# Patient Record
Sex: Male | Born: 1975 | Race: White | Hispanic: No | Marital: Married | Smoking: Never smoker
Health system: Southern US, Community
[De-identification: ages and names within clinical notes are randomized; demographics above are authoritative.]

## PROBLEM LIST (undated history)

## (undated) DIAGNOSIS — E7800 Pure hypercholesterolemia, unspecified: Secondary | ICD-10-CM

## (undated) DIAGNOSIS — E119 Type 2 diabetes mellitus without complications: Secondary | ICD-10-CM

## (undated) DIAGNOSIS — I10 Essential (primary) hypertension: Secondary | ICD-10-CM

## (undated) DIAGNOSIS — R569 Unspecified convulsions: Secondary | ICD-10-CM

## (undated) DIAGNOSIS — Z87442 Personal history of urinary calculi: Secondary | ICD-10-CM

## (undated) DIAGNOSIS — G4730 Sleep apnea, unspecified: Secondary | ICD-10-CM

## (undated) HISTORY — PX: OTHER SURGICAL HISTORY: SHX169

## (undated) HISTORY — DX: Unspecified convulsions: R56.9

---

## 1997-11-22 ENCOUNTER — Emergency Department (HOSPITAL_COMMUNITY): Admission: EM | Admit: 1997-11-22 | Discharge: 1997-11-22 | Payer: Self-pay | Admitting: Emergency Medicine

## 2007-12-03 ENCOUNTER — Emergency Department (HOSPITAL_COMMUNITY): Admission: EM | Admit: 2007-12-03 | Discharge: 2007-12-04 | Payer: Self-pay | Admitting: Emergency Medicine

## 2007-12-11 ENCOUNTER — Ambulatory Visit (HOSPITAL_COMMUNITY): Admission: RE | Admit: 2007-12-11 | Discharge: 2007-12-11 | Payer: Self-pay | Admitting: Orthopedic Surgery

## 2008-02-16 ENCOUNTER — Encounter: Admission: RE | Admit: 2008-02-16 | Discharge: 2008-02-16 | Payer: Self-pay | Admitting: Orthopedic Surgery

## 2008-03-20 ENCOUNTER — Ambulatory Visit (HOSPITAL_BASED_OUTPATIENT_CLINIC_OR_DEPARTMENT_OTHER): Admission: RE | Admit: 2008-03-20 | Discharge: 2008-03-21 | Payer: Self-pay | Admitting: Orthopedic Surgery

## 2009-12-08 ENCOUNTER — Encounter: Admission: RE | Admit: 2009-12-08 | Discharge: 2009-12-08 | Payer: Self-pay | Admitting: Family Medicine

## 2010-06-01 ENCOUNTER — Encounter: Payer: Self-pay | Admitting: Orthopedic Surgery

## 2010-07-19 IMAGING — CT CT EXTREM UP W/O CM*L*
4 series · 18 of 36 positions shown, 19 images · non-contrast
Comparison: Left forearm radiographs 12/03/2007

CLINICAL DATA: Distal radial fracture 12/03/2007 status post ORIF.
Persistent wrist pain with decreased range of motion at the distal
radioulnar joint.

CT OF THE LEFT WRIST WITHOUT CONTRAST
TECHNIQUE: Multidetector CT imaging of the left wrist and distal
forearm was performed according to the standard protocol without
intravenous contrast. Multiplanar CT image reconstructions were
also generated.

[Series 3: left wrist/soft tissue · axial · 0.38mm/px · z∈[+46,+160]mm · 7 of 277 slices shown]
[im 31/277  soft-tissue]
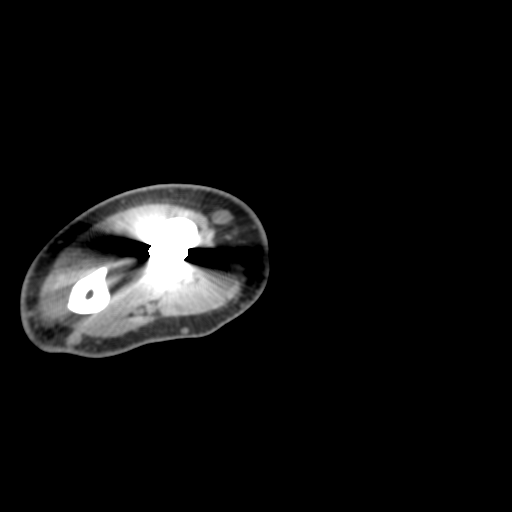
[im 62/277  soft-tissue]
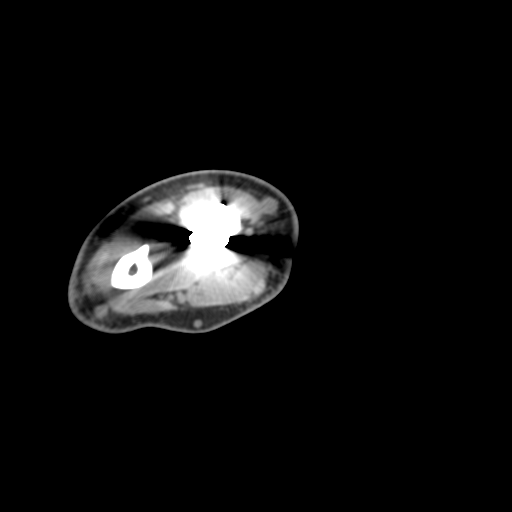
[im 93/277  soft-tissue]
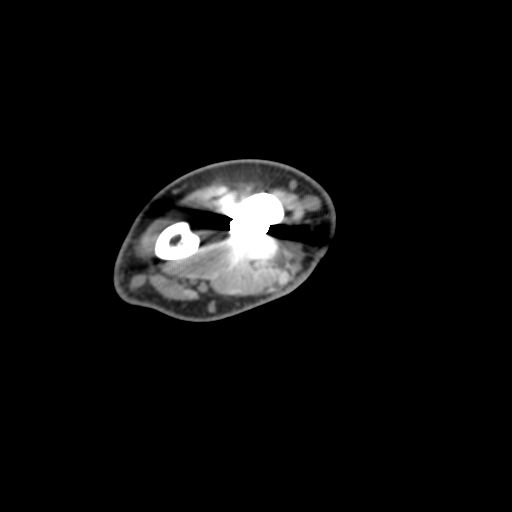
[im 123/277  soft-tissue]
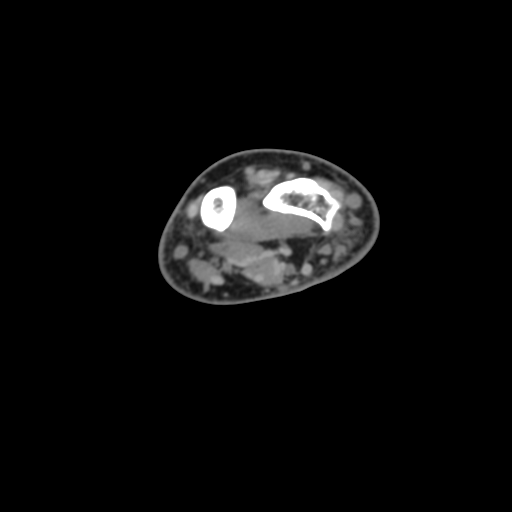
[im 154/277  soft-tissue]
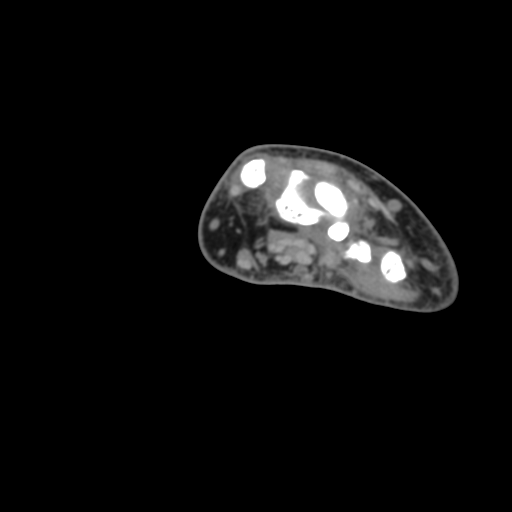
[im 185/277  soft-tissue]
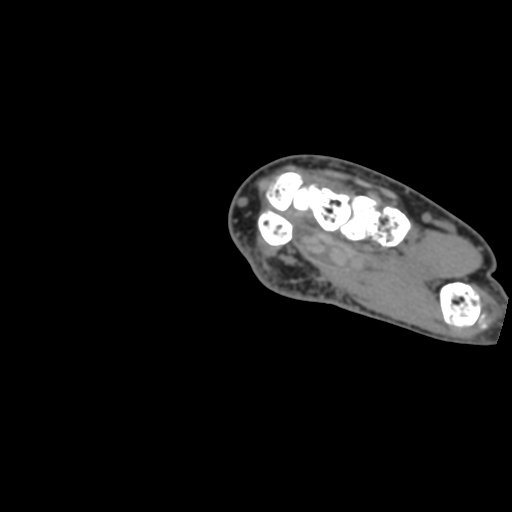
[im 215/277  soft-tissue]
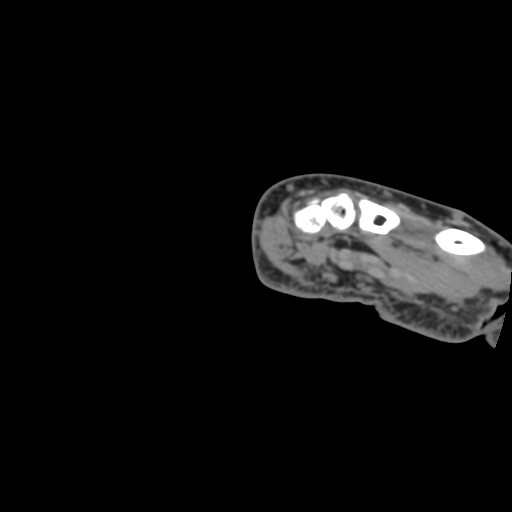

[Series 400: left wrist/axial/reformat · axial · 0.29mm/px · z∈[+31,+77]mm · 2 of 106 slices shown, 3 images]
[im 36/106  soft-tissue]
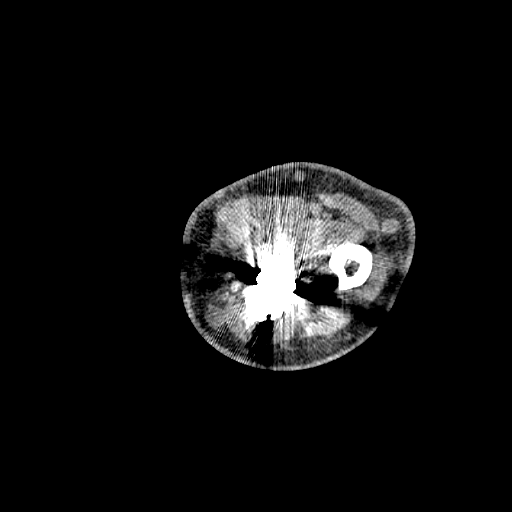
[im 36/106  bone]
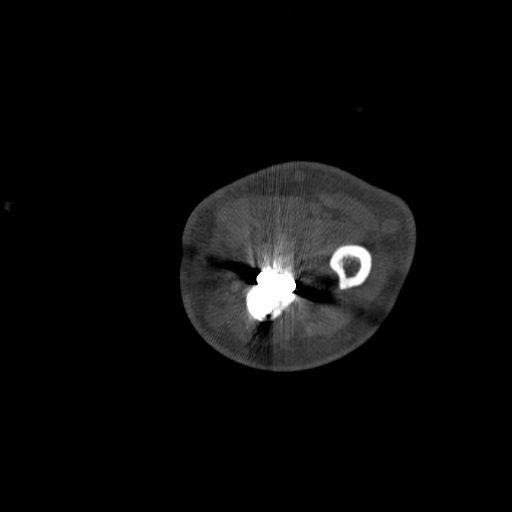
[im 71/106  bone]
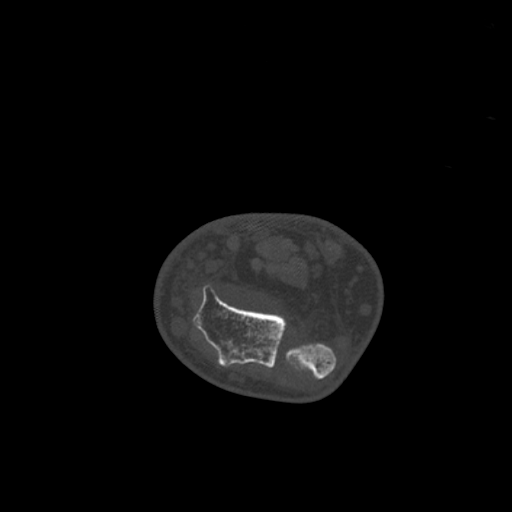

[Series 401: left wrist/cor · coronal · 0.29mm/px · 3 of 60 slices shown]
[im 12/60  bone]
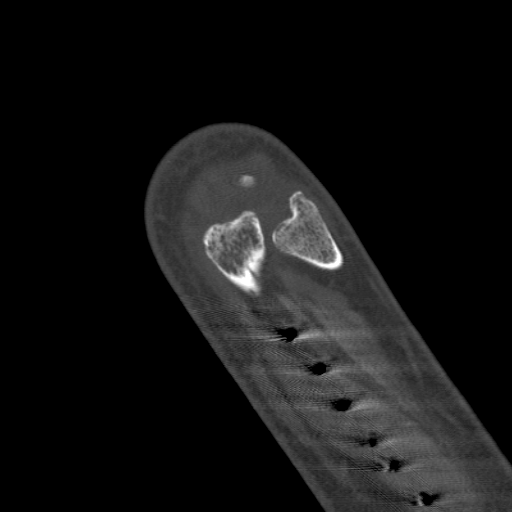
[im 24/60  bone]
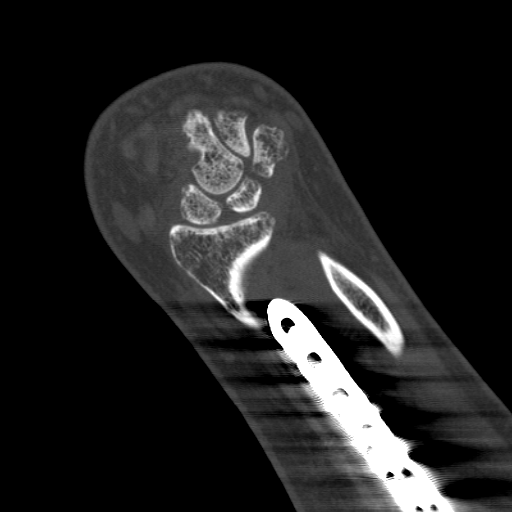
[im 36/60  bone]
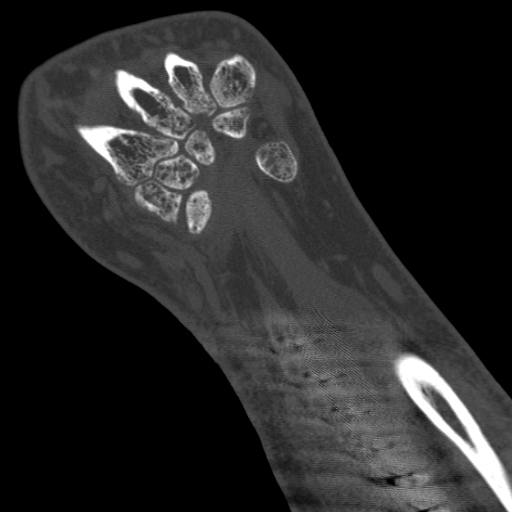

[Series 402: left wrist/sag · sagittal · 0.29mm/px · 6 of 85 slices shown]
[im 31/85  bone]
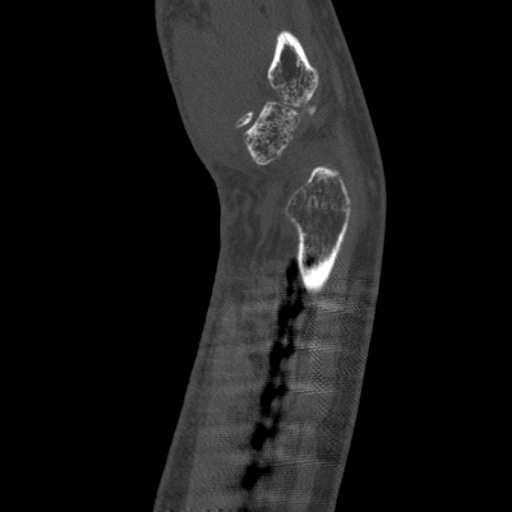
[im 37/85  bone]
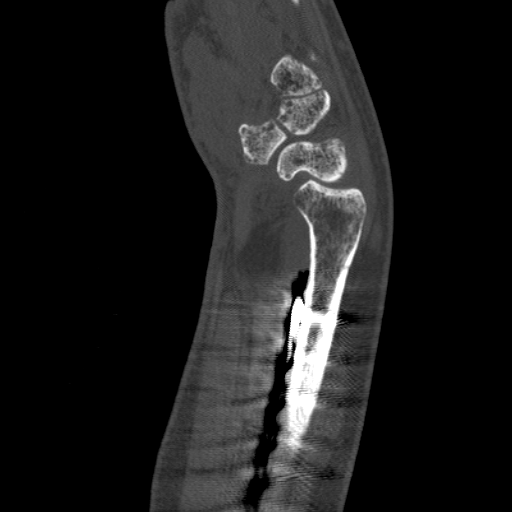
[im 44/85  bone]
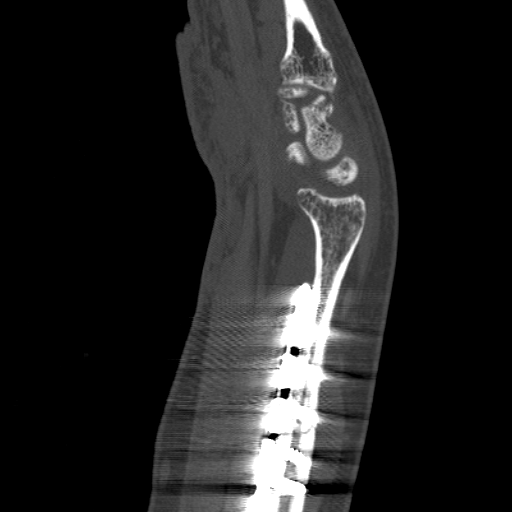
[im 51/85  bone]
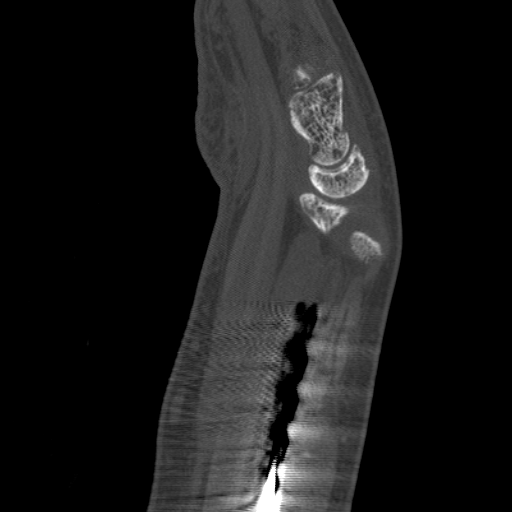
[im 57/85  bone]
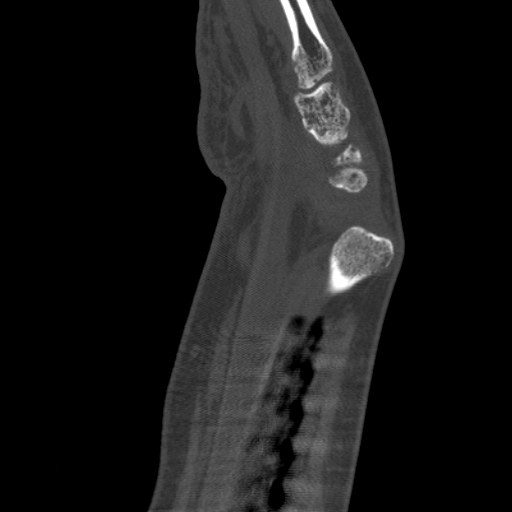
[im 58/85  soft-tissue]
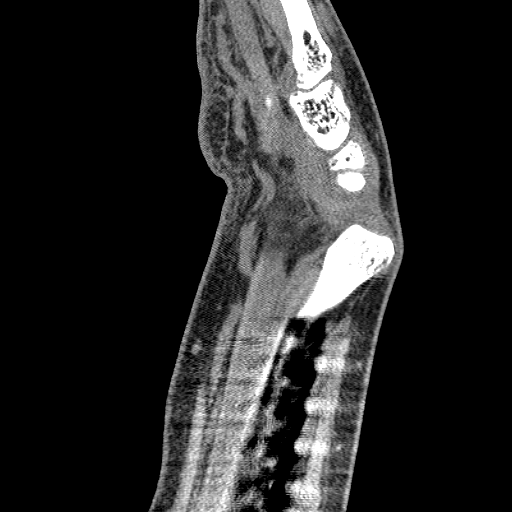

[18 of 36 positions shown; findings below may reference images not displayed]

FINDINGS: There is anatomic alignment of the distal radial
diaphyseal fractures status post plate and screw open reduction and
internal fixation.  The hardware is intact.  The fracture line
remains apparent, but there is evidence of interval partial
healing.  No acute fractures are demonstrated.

There is dorsal subluxation of the distal ulna from the sigmoid
notch of the distal radius.  Slight irregularity of the ulnar
styloid is noted, possibly due to a healing fracture at that
location.  A small well corticated ossicle adjacent to the ulnar
styloid is probably an accessory ossicle.

No carpal bone fracture is evident.  The carpal bone alignment is
normal.  There is diffuse osteopenia throughout the carpal bones
and visualized portions of the distal radius and ulna compatible
with disuse.  No acute soft tissue abnormalities are demonstrated.
IMPRESSION: 1.  Dorsal subluxation of the distal ulna from the sigmoid notch of
the distal radius consistent with Eliud Woodcock injury and distal
radioulnar joint instability.  There is a probable tear of the
palmar distal radial ulnar ligament, and this injury can be
associated with injury of the triangular fibrocartilage complex.
2.  Possible healing minimal fracture of the ulnar styloid.  No
carpal bone fractures are evident.
3.  Healing fracture of the distal radial diaphysis status post
ORIF.  Alignment is normal.
4.  Diffuse osteopenia.

## 2010-09-22 NOTE — Op Note (Signed)
[name], [name]               ACCOUNT NO.:  [id number]   MEDICAL RECORD NO.:  [id number]          PATIENT TYPE:  AMB   LOCATION:  DSC                          FACILITY:  [facility]   PHYSICIAN:  Artist Pais. 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   PROCEDURES:  1. Takedown malunion left distal radius with redo open reduction and      internal fixation using six-hole small fragment contoured DCP      plate.  2. Open reduction of dislocated distal radioulnar joint.   SURGEONS:  Artist Pais. Mina Marble, MD and Cindee Salt, MD   ANESTHESIA:  General and ax block.   TOURNIQUET TIME:  1 hour and 50 minutes.   COMPLICATIONS:  None.   DRAINS:  None.   OPERATIVE REPORT:  The patient was taken to the operating suite.  After  induction of adequate general anesthesia, left upper extremity and left  iliac crest were prepped and draped in usual sterile fashion.  Once this  was done, an Esmarch was used to exsanguinate the limb.  Tourniquet was  then inflated to 250 mmHg.  At this point in time and using his previous  incision on the volar aspect of the distal radius, the skin was incised.  Dissection was carried down to the interval between the radial artery  and the flexor carpi radialis.  This was carefully divided.  Dissection  was carried down to the level of the previously placed six-hole locking  plate.  The plate was carefully removed.  The fracture site was  identified and appeared to be nonunited with some motion of the fracture  site.  This was carefully freed up using a Therapist, nutritional.  Once the  fracture site had been freed up, the patient was placed into attempted  full supination.   There was still some block supination, therefore, the  hand was then pronated and incision was made over the distal radioulnar  joint and dissection was carried down.  The sheath overlying the  extensor digiti minimi was opened and the capsule overlying distal  radioulnar joint was carefully opened.  There was significant wear of  the articular cartilage on the ulnar head and interposed tissue was seen  to be between the sigmoid knots and the ulnar head was carefully  removed.  The hand was then able to be fully supinated and the distal  radioulnar joint was reduced.  At this point in time, two 0.62 K-wires  were driven across the distal radioulnar joint to hold the distal  radioulnar joint reduced.  While this was done, the hand was in full  supination.  Attention was then paid to the radius which was re-plated  using a contoured six-hole DCP plate using same drill holes as the  previous plate.  After this was done, the K-wires were actually removed  at this point in time, and the patient had full supination, full  pronation and distal radioulnar joint was stable in supination.  The  capsule overlying the distal radioulnar joint was repaired using a 2-0  undyed Vicryl as well as a sheath overlying the extensor digiti minimi,  2-0 Vicryl was then used to close the subcutaneous tissues of the volar  wound and 4-0 Monocryl subcuticular stitches were used to close both  skin  incisions.  The patient was then placed in a sterile dressing of Steri-  Strips, 4x4s, fluffs and a splint with the arm flexed to 90 degrees at  the elbow and full supination.  The patient tolerated the procedure well  and went to the recovery room in stable fashion.      Artist Pais Mina Marble, M.D.  Electronically Signed     MAW/MEDQ  D:  03/20/2008  T:  03/21/2008  Job:  [id number]

## 2010-09-22 NOTE — Op Note (Signed)
NAMEWAYDEN, SCHWERTNER               ACCOUNT NO.:  [id number]   MEDICAL RECORD NO.:  [id number]          PATIENT TYPE:  AMB   LOCATION:  SDS                          FACILITY:  [facility]   PHYSICIAN:  Nadara Mustard, MD     DATE OF BIRTH:  22-Jan-1976   DATE OF PROCEDURE:  12/11/2007  DATE OF DISCHARGE:  12/11/2007                               OPERATIVE REPORT   PREOPERATIVE DIAGNOSIS:  Left mid shaft radius fracture.   POSTOPERATIVE DIAGNOSIS:  Left mid shaft radius fracture.   PROCEDURE:  Open reduction and internal fixation, left radius.   SURGEON:  Nadara Mustard, MD   ANESTHESIA:  General.   ESTIMATED BLOOD LOSS:  Minimal.   ANTIBIOTIC:  Kefzol 1 g.   DRAINS:  None.   COMPLICATIONS:  None.   TOURNIQUET TIME:  None.   DISPOSITION:  To PACU in stable condition.   INDICATIONS FOR PROCEDURE:  The patient is a 35 year old gentleman who  sustained the left mid shaft radius fracture.  It is a Galeazzi-type  fracture, it is unstable.  The patient do not have a dislocation at  distal radioulnar joint and presents at this time for open reduction and  internal fixation.  Risks and benefits were discussed including  infection, neurovascular injury, persistent pain, failure of fixation,  and need for additional surgery.  The patient states he understands and  wished to proceed at this time.   PROCEDURE:  The patient was brought to OR room #4  and underwent a  general anesthetic.  After adequate level of anesthesia was obtained,  the patient's left upper extremity was prepped using DuraPrep and draped  into a sterile field.  An extensile approach of Sherilyn Cooter was used.  This  was carried down.  The vascular bundle was retracted ulnarly.  Dissection was carried down to the radius.  The fracture was freshened,  and the fracture was reduced.  Using a 6-hole 3.5 locking cortical  plate, six screws were placed in total with three screws proximally and  three screws distally with a total of  six cortices, both proximally and  distally, five of the screws were locking.  One screw was placed that  was a nonlocking screw to allow for compression across the fracture  site.  The wound was irrigated with normal saline.  The subcu was closed  using 2-0 Vicryl.  The skin was closed using Proximate staples.  Postreduction radiographs showed a congruent distal radial and ulnar  joint.  There was a good alignment in both AP  and lateral planes.  After closure with staples, the wound edges were  infiltrated with a total of 30 mL of 0.5% Marcaine plain.  The wounds  were covered with adaptic, orthopedic sponges, Webril, and Coban.  The  patient was extubated and taken to the PACU in stable condition.  Plan  for discharge is to home.  Prescription for Tylox for pain.      Nadara Mustard, MD  Electronically Signed     MVD/MEDQ  D:  12/11/2007  T:  12/12/2007  Job:  [id number]

## 2011-02-05 LAB — COMPREHENSIVE METABOLIC PANEL
ALT: 75 — ABNORMAL HIGH
AST: 55 — ABNORMAL HIGH
BUN: 13
Chloride: 100
Potassium: 3.1 — ABNORMAL LOW
Total Protein: 7.4

## 2011-02-05 LAB — CBC
HCT: 42.2
Hemoglobin: 14.4
MCV: 86.9
Platelets: 233
RDW: 13.2
WBC: 5.2

## 2011-02-09 LAB — POCT I-STAT, CHEM 8
BUN: 17
Calcium, Ion: 1.24
Chloride: 103
Creatinine, Ser: 1
Glucose, Bld: 91
HCT: 40
Hemoglobin: 13.6
Potassium: 3.9
Sodium: 141
TCO2: 27

## 2011-02-09 LAB — POCT HEMOGLOBIN-HEMACUE
Hemoglobin: 13.7
Hemoglobin: 13.7

## 2013-01-19 ENCOUNTER — Emergency Department (HOSPITAL_BASED_OUTPATIENT_CLINIC_OR_DEPARTMENT_OTHER)
Admission: EM | Admit: 2013-01-19 | Discharge: 2013-01-19 | Disposition: A | Discharge status: Home / Self Care | Payer: BC Managed Care – PPO | Attending: Emergency Medicine | Admitting: Emergency Medicine

## 2013-01-19 ENCOUNTER — Encounter (HOSPITAL_BASED_OUTPATIENT_CLINIC_OR_DEPARTMENT_OTHER): Payer: Self-pay | Admitting: Emergency Medicine

## 2013-01-19 DIAGNOSIS — R339 Retention of urine, unspecified: Secondary | ICD-10-CM | POA: Insufficient documentation

## 2013-01-19 DIAGNOSIS — Z79899 Other long term (current) drug therapy: Secondary | ICD-10-CM | POA: Insufficient documentation

## 2013-01-19 DIAGNOSIS — Z862 Personal history of diseases of the blood and blood-forming organs and certain disorders involving the immune mechanism: Secondary | ICD-10-CM | POA: Insufficient documentation

## 2013-01-19 DIAGNOSIS — Z8639 Personal history of other endocrine, nutritional and metabolic disease: Secondary | ICD-10-CM | POA: Insufficient documentation

## 2013-01-19 DIAGNOSIS — I10 Essential (primary) hypertension: Secondary | ICD-10-CM | POA: Insufficient documentation

## 2013-01-19 HISTORY — DX: Essential (primary) hypertension: I10

## 2013-01-19 HISTORY — DX: Pure hypercholesterolemia, unspecified: E78.00

## 2013-01-19 LAB — URINALYSIS, ROUTINE W REFLEX MICROSCOPIC
Bilirubin Urine: NEGATIVE
Hgb urine dipstick: NEGATIVE
Nitrite: NEGATIVE
Protein, ur: NEGATIVE mg/dL
Specific Gravity, Urine: 1.018 (ref 1.005–1.030)
Urobilinogen, UA: 0.2 mg/dL (ref 0.0–1.0)

## 2013-01-19 NOTE — ED Notes (Signed)
MD at bedside. 

## 2013-01-19 NOTE — ED Notes (Signed)
Pt having dysuria x one week.  Pt feels like he cannot urinate easily, difficult to start, urgency and frequency.  Pt states he has been seen at MD office and referred to urologist.

## 2013-01-19 NOTE — ED Provider Notes (Signed)
CSN: [id number]     Arrival date & time 01/19/13  1646 History   This chart was scribed for Candyce Churn, MD by Karle Plumber, ED Scribe. This patient was seen in room MH10/MH10 and the patient's care was started at 6:19 PM.    Chief Complaint  Patient presents with  . Dysuria    Patient is a 37 y.o. male presenting with dysuria. The history is provided by the patient. No language interpreter was used.  Dysuria   HPI Comments:  [name] is a 37 y.o. male who presents to the Emergency Department complaining of intermittent difficulty urinating onset one week ago with moderate pain. Pt states this is the first time he has experienced these symptoms. He states he has had associated back pain and spots of blood on the tip of his penis. Pt states he saw his primary care physician and received a referral to the urologist and the appointment is in four days. He states that pressing on his sides makes the pain worse. He reports two episodes of intermittent bladder incontinence with the most recent being one month ago. He states that he has gone frequently since presenting to the ED today. Pt denies fever, testicular pain or any lower extremity numbness or tingling. Pt denies any personal history of kidney stones however his mother and father have had a history of them. He states he has been referred to urology from his primary physician.   Past Medical History  Diagnosis Date  . Hypertension   . Hypercholesteremia    No past surgical history on file. History reviewed. No pertinent family history. History  Substance Use Topics  . Smoking status: Never Smoker   . Smokeless tobacco: Not on file  . Alcohol Use: Yes     Comment: occasioanal    Review of Systems  Constitutional: Negative for fever.  Genitourinary: Positive for difficulty urinating. Negative for testicular pain.  Neurological: Negative for weakness and numbness.       Denies tingling.  All other systems reviewed and  are negative.    Allergies  Review of patient's allergies indicates no known allergies.  Home Medications   Current Outpatient Rx  Name  Route  Sig  Dispense  Refill  . amlodipine-benazepril (LOTREL) 2.5-10 MG per capsule   Oral   Take 1 capsule by mouth daily.         Marland Kitchen losartan (COZAAR) 50 MG tablet   Oral   Take 50 mg by mouth daily.         Marland Kitchen UNKNOWN TO PATIENT      Cholesterol medication          Triage Vitals: BP 129/89  Pulse 91  Temp(Src) 99.2 F (37.3 C) (Oral)  Resp 16  Ht 5\' 8"  (1.727 m)  Wt 200 lb (90.719 kg)  BMI 30.42 kg/m2  SpO2 100% Physical Exam  Nursing note and vitals reviewed. Constitutional: He is oriented to person, place, and time. He appears well-developed and well-nourished. No distress.  HENT:  Head: Normocephalic and atraumatic.  Mouth/Throat: Oropharynx is clear and moist.  Eyes: Conjunctivae are normal. Pupils are equal, round, and reactive to light. No scleral icterus.  Neck: Neck supple.  Cardiovascular: Normal rate, regular rhythm, normal heart sounds and intact distal pulses.   No murmur heard. Pulmonary/Chest: Effort normal and breath sounds normal. No stridor. No respiratory distress. He has no wheezes. He has no rales.  Abdominal: Soft. Normal appearance. He exhibits no distension. There is  no tenderness.  Genitourinary: Penis normal. Right testis shows no mass and no tenderness. Left testis shows no mass and no tenderness.  Musculoskeletal: Normal range of motion. He exhibits no edema.  Neurological: He is alert and oriented to person, place, and time.  Skin: Skin is warm and dry. No rash noted.  Psychiatric: He has a normal mood and affect. His behavior is normal.    ED Course  Procedures (including critical care time) DIAGNOSTIC STUDIES: Oxygen Saturation is 100% on RA, normal by my interpretation.   COORDINATION OF CARE:  6:28 PM- Discussed plan to obtain bladder scan. Advised pt to keep follow up appt with  Urologist. Pt verbalizes understanding and agrees to plan.  Medications - No data to display  Labs Review Labs Reviewed  URINALYSIS, ROUTINE W REFLEX MICROSCOPIC - Abnormal; Notable for the following:    APPearance CLOUDY (*)    All other components within normal limits   Imaging Review No results found.  MDM  No diagnosis found. 37 year old male with urinary retention. He has an appointment with a urologist in 5 days. However today he had sudden worsening of his symptoms. He was finally able to void some just before arrival. Postvoid residual showed 250 cc of urine. Urinalysis was normal. Of note, he had no back tenderness or pain, no fevers, no numbness in his groin, no weakness or loss of sensation of his lower extremity. Urinary retention most likely obstructive. Foley was recommended, but patient declined this. Return precautions given.  I personally performed the services described in this documentation, which was scribed in my presence. The recorded information has been reviewed and is accurate.     Candyce Churn, MD 01/20/13 [phone]

## 2017-09-27 DIAGNOSIS — M67431 Ganglion, right wrist: Secondary | ICD-10-CM | POA: Insufficient documentation

## 2018-06-21 DIAGNOSIS — E669 Obesity, unspecified: Secondary | ICD-10-CM | POA: Insufficient documentation

## 2018-06-21 DIAGNOSIS — I10 Essential (primary) hypertension: Secondary | ICD-10-CM | POA: Insufficient documentation

## 2018-10-28 DIAGNOSIS — G4733 Obstructive sleep apnea (adult) (pediatric): Secondary | ICD-10-CM | POA: Insufficient documentation

## 2019-06-27 DIAGNOSIS — E782 Mixed hyperlipidemia: Secondary | ICD-10-CM | POA: Insufficient documentation

## 2019-06-27 DIAGNOSIS — E1165 Type 2 diabetes mellitus with hyperglycemia: Secondary | ICD-10-CM | POA: Insufficient documentation

## 2019-06-27 DIAGNOSIS — E119 Type 2 diabetes mellitus without complications: Secondary | ICD-10-CM | POA: Insufficient documentation

## 2019-08-14 ENCOUNTER — Emergency Department (HOSPITAL_COMMUNITY)
Admission: EM | Admit: 2019-08-14 | Discharge: 2019-08-14 | Disposition: A | Discharge status: Home / Self Care | Payer: BLUE CROSS/BLUE SHIELD | Attending: Emergency Medicine | Admitting: Emergency Medicine

## 2019-08-14 ENCOUNTER — Emergency Department (HOSPITAL_COMMUNITY): Payer: BLUE CROSS/BLUE SHIELD

## 2019-08-14 ENCOUNTER — Encounter (HOSPITAL_COMMUNITY): Payer: Self-pay

## 2019-08-14 ENCOUNTER — Other Ambulatory Visit: Payer: Self-pay

## 2019-08-14 DIAGNOSIS — N23 Unspecified renal colic: Secondary | ICD-10-CM | POA: Diagnosis not present

## 2019-08-14 DIAGNOSIS — Z79899 Other long term (current) drug therapy: Secondary | ICD-10-CM | POA: Diagnosis not present

## 2019-08-14 DIAGNOSIS — I10 Essential (primary) hypertension: Secondary | ICD-10-CM | POA: Diagnosis not present

## 2019-08-14 DIAGNOSIS — R109 Unspecified abdominal pain: Secondary | ICD-10-CM | POA: Diagnosis present

## 2019-08-14 LAB — URINALYSIS, ROUTINE W REFLEX MICROSCOPIC
Bilirubin Urine: NEGATIVE
Glucose, UA: NEGATIVE mg/dL
Ketones, ur: NEGATIVE mg/dL
Leukocytes,Ua: NEGATIVE
Nitrite: NEGATIVE
Protein, ur: NEGATIVE mg/dL
Specific Gravity, Urine: 1.009 (ref 1.005–1.030)
pH: 5 (ref 5.0–8.0)

## 2019-08-14 MED ORDER — TAMSULOSIN HCL 0.4 MG PO CAPS: 0.4000 mg | ORAL_CAPSULE | Freq: Every day | ORAL | 0 refills | Status: DC

## 2019-08-14 MED ORDER — HYDROCODONE-ACETAMINOPHEN 5-300 MG PO TABS: 5.0000 mg | ORAL_TABLET | ORAL | 0 refills | Status: DC | PRN

## 2019-08-14 NOTE — ED Notes (Signed)
Patient ambulated to the restroom with no difficulty   ° °

## 2019-08-14 NOTE — ED Notes (Signed)
Patient returned from CT

## 2019-08-14 NOTE — ED Provider Notes (Signed)
[facility] DEPT Provider Note   CSN: [id number] Arrival date & time: 08/14/19  3244     History Chief Complaint  Patient presents with  . Flank Pain    [name] is a 44 y.o. male.  44 year old male presents with acute onset of right-sided flank pain which began yesterday.  2 days ago he did have some dark urine but denies any dysuria.  No fever or chills.  Pain radiates to his right groin but denies any testicular pain or swelling.  Pain has been so severe that he has had emesis.  Symptoms wax and wane and no prior diagnosis of kidney stone.  Took some Motrin with some relief        Past Medical History:  Diagnosis Date  . Hypercholesteremia   . Hypertension   . Seizure [facility])    as a child    There are no problems to display for this patient.   Past Surgical History:  Procedure Laterality Date  . arm fracture Left        History reviewed. No pertinent family history.  Social History   Tobacco Use  . Smoking status: Never Smoker  Substance Use Topics  . Alcohol use: Yes    Comment: occasioanal  . Drug use: Not on file    Home Medications Prior to Admission medications   Medication Sig Start Date End Date Taking? Authorizing Provider  amlodipine-benazepril (LOTREL) 2.5-10 MG per capsule Take 1 capsule by mouth daily.    [provider]  b complex vitamins capsule Take 1 capsule by mouth daily.    [provider]  gemfibrozil (LOPID) 600 MG tablet Take 600 mg by mouth 2 (two) times daily before a meal.    [provider]  losartan (COZAAR) 50 MG tablet Take 50 mg by mouth daily.    [provider]  Multiple Vitamin (MULTI VITAMIN DAILY PO) Take by mouth.    [provider]  Red Yeast Rice Extract (RED YEAST RICE PO) Take by mouth.    [provider]    Allergies    Trazodone and nefazodone  Review of Systems   Review of Systems  All other systems reviewed and are  negative.   Physical Exam Updated Vital Signs BP 135/86 (BP Location: Left Arm)   Pulse 76   Temp 98.4 F (36.9 C) (Oral)   Resp 18   Ht 1.753 m (5\' 9" )   Wt 103.4 kg   SpO2 98%   BMI 33.67 kg/m   Physical Exam Vitals and nursing note reviewed.  Constitutional:      General: He is not in acute distress.    Appearance: Normal appearance. He is well-developed. He is not toxic-appearing.  HENT:     Head: Normocephalic and atraumatic.  Eyes:     General: Lids are normal.     Conjunctiva/sclera: Conjunctivae normal.     Pupils: Pupils are equal, round, and reactive to light.  Neck:     Thyroid: No thyroid mass.     Trachea: No tracheal deviation.  Cardiovascular:     Rate and Rhythm: Normal rate and regular rhythm.     Heart sounds: Normal heart sounds. No murmur. No gallop.   Pulmonary:     Effort: Pulmonary effort is normal. No respiratory distress.     Breath sounds: Normal breath sounds. No stridor. No decreased breath sounds, wheezing, rhonchi or rales.  Abdominal:     General: Bowel sounds are normal.  There is no distension.     Palpations: Abdomen is soft.     Tenderness: There is no abdominal tenderness. There is no rebound.  Musculoskeletal:        General: No tenderness. Normal range of motion.     Cervical back: Normal range of motion and neck supple.  Skin:    General: Skin is warm and dry.     Findings: No abrasion or rash.  Neurological:     Mental Status: He is alert and oriented to person, place, and time.     GCS: GCS eye subscore is 4. GCS verbal subscore is 5. GCS motor subscore is 6.     Cranial Nerves: No cranial nerve deficit.     Sensory: No sensory deficit.  Psychiatric:        Speech: Speech normal.        Behavior: Behavior normal.     ED Results / Procedures / Treatments   Labs (all labs ordered are listed, but only abnormal results are displayed) Labs Reviewed  URINALYSIS, ROUTINE W REFLEX MICROSCOPIC - Abnormal; Notable for the  following components:      Result Value   Hgb urine dipstick LARGE (*)    Bacteria, UA RARE (*)    All other components within normal limits    EKG None  Radiology No results found.  Procedures Procedures (including critical care time)  Medications Ordered in ED Medications - No data to display  ED Course  I have reviewed the triage vital signs and the nursing notes.  Pertinent labs & imaging results that were available during my care of the patient were reviewed by me and considered in my medical decision making (see chart for details).    MDM Rules/Calculators/A&P                      Patient's urinalysis noted for hematuria.  CT scan consistent with kidney stone.  Pain is controlled at this time.  Prescribe medications to give referral to urology Final Clinical Impression(s) / ED Diagnoses Final diagnoses:  None    Rx / DC Orders ED Discharge Orders    None       Lorre Nick, MD 08/14/19 [phone]

## 2019-08-14 NOTE — ED Notes (Signed)
Patient to CT at this time

## 2019-08-14 NOTE — ED Triage Notes (Signed)
Pt reports R sided flank pain. Denies dysuria. States that the pain started yesterday and he has been up all night, unable to get comfortable. Endorses nausea with several episodes of emesis. Denies hx of kidney stones.

## 2019-09-19 DIAGNOSIS — D649 Anemia, unspecified: Secondary | ICD-10-CM | POA: Insufficient documentation

## 2020-06-11 DIAGNOSIS — N201 Calculus of ureter: Secondary | ICD-10-CM | POA: Insufficient documentation

## 2020-06-19 DIAGNOSIS — N35919 Unspecified urethral stricture, male, unspecified site: Secondary | ICD-10-CM | POA: Insufficient documentation

## 2021-07-07 ENCOUNTER — Other Ambulatory Visit: Payer: Self-pay

## 2021-07-09 ENCOUNTER — Encounter: Payer: Self-pay | Admitting: Family Medicine

## 2021-07-09 ENCOUNTER — Ambulatory Visit (INDEPENDENT_AMBULATORY_CARE_PROVIDER_SITE_OTHER): Payer: BC Managed Care – PPO | Admitting: Family Medicine

## 2021-07-09 ENCOUNTER — Other Ambulatory Visit: Payer: Self-pay

## 2021-07-09 VITALS — BP 138/78 | HR 69 | Temp 97.6°F | Ht 69.0 in | Wt 234.0 lb

## 2021-07-09 DIAGNOSIS — Z1211 Encounter for screening for malignant neoplasm of colon: Secondary | ICD-10-CM | POA: Diagnosis not present

## 2021-07-09 DIAGNOSIS — N35819 Other urethral stricture, male, unspecified site: Secondary | ICD-10-CM

## 2021-07-09 DIAGNOSIS — H6122 Impacted cerumen, left ear: Secondary | ICD-10-CM | POA: Insufficient documentation

## 2021-07-09 DIAGNOSIS — F419 Anxiety disorder, unspecified: Secondary | ICD-10-CM

## 2021-07-09 DIAGNOSIS — N200 Calculus of kidney: Secondary | ICD-10-CM

## 2021-07-09 DIAGNOSIS — E119 Type 2 diabetes mellitus without complications: Secondary | ICD-10-CM

## 2021-07-09 DIAGNOSIS — I10 Essential (primary) hypertension: Secondary | ICD-10-CM | POA: Diagnosis not present

## 2021-07-09 DIAGNOSIS — E669 Obesity, unspecified: Secondary | ICD-10-CM

## 2021-07-09 DIAGNOSIS — E782 Mixed hyperlipidemia: Secondary | ICD-10-CM

## 2021-07-09 DIAGNOSIS — F341 Dysthymic disorder: Secondary | ICD-10-CM

## 2021-07-09 DIAGNOSIS — N201 Calculus of ureter: Secondary | ICD-10-CM

## 2021-07-09 DIAGNOSIS — Z794 Long term (current) use of insulin: Secondary | ICD-10-CM | POA: Diagnosis not present

## 2021-07-09 LAB — URINALYSIS, ROUTINE W REFLEX MICROSCOPIC
Bilirubin Urine: NEGATIVE
Hgb urine dipstick: NEGATIVE
Leukocytes,Ua: NEGATIVE
Nitrite: NEGATIVE
RBC / HPF: NONE SEEN (ref 0–?)
Specific Gravity, Urine: >=1.03 — AB (ref 1.000–1.030)
Total Protein, Urine: NEGATIVE
Urine Glucose: >=1000 — AB
Urobilinogen, UA: 0.2 (ref 0.0–1.0)
pH: 5.5 (ref 5.0–8.0)

## 2021-07-09 LAB — COMPREHENSIVE METABOLIC PANEL
ALT: 39 U/L (ref 0–53)
AST: 22 U/L (ref 0–37)
Albumin: 4.5 g/dL (ref 3.5–5.2)
Alkaline Phosphatase: 65 U/L (ref 39–117)
BUN: 16 mg/dL (ref 6–23)
CO2: 25 mEq/L (ref 19–32)
Calcium: 9.4 mg/dL (ref 8.4–10.5)
Chloride: 100 mEq/L (ref 96–112)
Creatinine, Ser: 0.8 mg/dL (ref 0.40–1.50)
GFR: 105.76 mL/min (ref >60.00)
Glucose, Bld: 264 mg/dL — ABNORMAL HIGH (ref 70–99)
Potassium: 4 mEq/L (ref 3.5–5.1)
Sodium: 135 mEq/L (ref 135–145)
Total Bilirubin: 0.5 mg/dL (ref 0.2–1.2)
Total Protein: 7.3 g/dL (ref 6.0–8.3)

## 2021-07-09 LAB — MICROALBUMIN / CREATININE URINE RATIO
Creatinine,U: 147.7 mg/dL
Microalb Creat Ratio: 2.2 mg/g (ref 0.0–30.0)
Microalb, Ur: 3.2 mg/dL — ABNORMAL HIGH (ref 0.0–1.9)

## 2021-07-09 LAB — CBC
HCT: 37.1 % — ABNORMAL LOW (ref 39.0–52.0)
Hemoglobin: 13.1 g/dL (ref 13.0–17.0)
MCHC: 35.3 g/dL (ref 30.0–36.0)
MCV: 84.8 fl (ref 78.0–100.0)
Platelets: 239 10*3/uL (ref 150.0–400.0)
RBC: 4.37 Mil/uL (ref 4.22–5.81)
RDW: 13.8 % (ref 11.5–15.5)
WBC: 4.2 10*3/uL (ref 4.0–10.5)

## 2021-07-09 LAB — HEMOGLOBIN A1C: Hgb A1c MFr Bld: 9.3 % — ABNORMAL HIGH (ref 4.6–6.5)

## 2021-07-09 LAB — LIPID PANEL
Cholesterol: 242 mg/dL — ABNORMAL HIGH (ref 0–200)
HDL: 35.2 mg/dL — ABNORMAL LOW (ref >39.00)
Total CHOL/HDL Ratio: 7
Triglycerides: 1175 mg/dL — ABNORMAL HIGH (ref 0.0–149.0)

## 2021-07-09 LAB — LDL CHOLESTEROL, DIRECT: Direct LDL: 75 mg/dL

## 2021-07-09 NOTE — Progress Notes (Signed)
? ?New Patient Office Visit ? ?Subjective:  ?Patient ID: [name], male    DOB: 10-12-1975  Age: 46 y.o. MRN: [id number] ? ?CC:  ?Chief Complaint  ?Patient presents with  ? Establish Care  ?  NP/establish care, would like referral for colonoscopy, eye doctor, and urologist.  ? ? ?HPI ?[name] presents for establishment of care and follow-up of hypertension, diabetes, mixed hyperlipidemia, urethral stricture with recurrent renal lithiasis, dysthymia with anxiety.  He is married and has a 70 year old child.  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  Running 2 businesses is led to a lot of stress anxiety and sadness.  Developed ongoing severe abdominal cramping and loose stools with metformin.  He cannot tolerate it. ? ?Past Medical History:  ?Diagnosis Date  ? Hypercholesteremia   ? Hypertension   ? Seizure ([place])   ? as a child  ? ? ?Past Surgical History:  ?Procedure Laterality Date  ? arm fracture Left   ? ? ?History reviewed. No pertinent family history. ? ?Social History  ? ?Socioeconomic History  ? Marital status: Married  ?  Spouse name: Not on file  ? Number of children: Not on file  ? Years of education: Not on file  ? Highest education level: Not on file  ?Occupational History  ? Not on file  ?Tobacco Use  ? Smoking status: Never  ? Smokeless tobacco: Never  ?Vaping Use  ? Vaping Use: Never used  ?Substance and Sexual Activity  ? Alcohol use: Yes  ?  Comment: occasioanal  ? Drug use: Never  ? Sexual activity: Yes  ?Other Topics Concern  ? Not on file  ?Social History Narrative  ? Not on file  ? ?Social Determinants of Health  ? ?Financial Resource Strain: Not on file  ?Food Insecurity: Not on file  ?Transportation Needs: Not on file  ?Physical Activity: Not on file  ?Stress: Not on file  ?Social Connections: Not on file  ?Intimate Partner Violence: Not on file  ? ? ?ROS ?Review of Systems  ?Constitutional:  Negative for chills, diaphoresis, fatigue, fever and unexpected weight change.  ?HENT:  Negative.    ?Eyes:  Negative for photophobia and visual disturbance.  ?Respiratory: Negative.    ?Cardiovascular: Negative.   ?Gastrointestinal: Negative.   ?Endocrine: Negative for polyphagia and polyuria.  ?Genitourinary: Negative.   ?Musculoskeletal:  Negative for gait problem and joint swelling.  ?Neurological:  Negative for speech difficulty, weakness and light-headedness.  ? ?Objective:  ? ?Today's Vitals: BP 138/78 (BP Location: Right Arm, Patient Position: Sitting, Cuff Size: Large)   Pulse 69   Temp 97.6 ?F (36.4 ?C) (Temporal)   Ht 5' 9"  (1.753 m)   Wt 234 lb (106.1 kg)   SpO2 97%   BMI 34.56 kg/m?  ? ?Physical Exam ?Vitals and nursing note reviewed.  ?Constitutional:   ?   General: He is not in acute distress. ?   Appearance: Normal appearance. He is obese. He is not ill-appearing, toxic-appearing or diaphoretic.  ?HENT:  ?   Head: Normocephalic and atraumatic.  ?   Right Ear: Tympanic membrane, ear canal and external ear normal.  ?   Left Ear: There is impacted cerumen.  ?   Mouth/Throat:  ?   Mouth: Mucous membranes are moist.  ?   Pharynx: Oropharynx is clear. No oropharyngeal exudate or posterior oropharyngeal erythema.  ?Eyes:  ?   Extraocular Movements: Extraocular movements intact.  ?   Conjunctiva/sclera: Conjunctivae normal.  ?  Pupils: Pupils are equal, round, and reactive to light.  ?Cardiovascular:  ?   Rate and Rhythm: Normal rate.  ?Pulmonary:  ?   Effort: Pulmonary effort is normal.  ?   Breath sounds: Normal breath sounds.  ?Abdominal:  ?   General: Bowel sounds are normal.  ?Musculoskeletal:  ?   Cervical back: No rigidity or tenderness.  ?Lymphadenopathy:  ?   Cervical: No cervical adenopathy.  ?Skin: ?   General: Skin is warm and dry.  ?Neurological:  ?   Mental Status: He is alert and oriented to person, place, and time.  ?Psychiatric:     ?   Mood and Affect: Mood normal.     ?   Behavior: Behavior normal.  ? ? ?Assessment & Plan:  ? ?Problem List Items Addressed This Visit    ? ?  ? Cardiovascular and Mediastinum  ? Essential hypertension  ? Relevant Medications  ? amLODipine-olmesartan (AZOR) 10-40 MG tablet  ? atenolol (TENORMIN) 50 MG tablet  ? fenofibrate (TRICOR) 145 MG tablet  ? Other Relevant Orders  ? CBC  ? Comp Met (CMET)  ?  ? Endocrine  ? Type 2 diabetes mellitus without complication, with long-term current use of insulin ([place])  ? Relevant Medications  ? amLODipine-olmesartan (AZOR) 10-40 MG tablet  ? glipiZIDE (GLUCOTROL) 10 MG tablet  ? Other Relevant Orders  ? Ambulatory referral to Ophthalmology  ? Comp Met (CMET)  ? Hemoglobin A1c  ? Urinalysis, Routine w reflex microscopic  ? Microalbumin / creatinine urine ratio  ?  ? Nervous and Auditory  ? Impacted cerumen of left ear  ?  ? Genitourinary  ? Left ureteral stone  ? Relevant Orders  ? Ambulatory referral to Urology  ? Urethral stricture  ? Relevant Orders  ? Ambulatory referral to Urology  ? Kidney stones - Primary  ? Relevant Orders  ? Ambulatory referral to Urology  ?  ? Other  ? Mixed hyperlipidemia  ? Relevant Medications  ? amLODipine-olmesartan (AZOR) 10-40 MG tablet  ? atenolol (TENORMIN) 50 MG tablet  ? fenofibrate (TRICOR) 145 MG tablet  ? Other Relevant Orders  ? Lipid Profile  ? Comp Met (CMET)  ? Urinalysis, Routine w reflex microscopic  ? Microalbumin / creatinine urine ratio  ? Obesity (BMI 30.0-34.9)  ? Anxiety  ? Relevant Medications  ? venlafaxine (EFFEXOR) 75 MG tablet  ? Dysthymia  ? Relevant Medications  ? venlafaxine (EFFEXOR) 75 MG tablet  ? Encounter for screening colonoscopy  ? Relevant Orders  ? Ambulatory referral to Gastroenterology  ? ? ?Outpatient Encounter Medications as of 07/09/2021  ?Medication Sig  ? amLODipine-olmesartan (AZOR) 10-40 MG tablet Take 1 tablet by mouth daily.  ? ascorbic acid (VITAMIN C) 500 MG tablet Take by mouth.  ? atenolol (TENORMIN) 50 MG tablet Take 50 mg by mouth 2 (two) times daily.  ? Cholecalciferol 50 MCG (2000 UT) CAPS Take by mouth.  ? fenofibrate (TRICOR)  145 MG tablet Take 1 tablet by mouth daily.  ? Garlic 5465 MG CAPS Take by mouth.  ? glipiZIDE (GLUCOTROL) 10 MG tablet Take 10 mg by mouth 2 (two) times daily.  ? Multiple Vitamin (MULTI VITAMIN DAILY PO) Take by mouth.  ? Potassium 75 MG TABS Take by mouth.  ? Red Yeast Rice Extract (RED YEAST RICE PO) Take by mouth.  ? tamsulosin (FLOMAX) 0.4 MG CAPS capsule Take 1 capsule (0.4 mg total) by mouth daily.  ? UNABLE TO FIND CBD 25 mg  tablet, 1 tablet oral daily as needed  ? venlafaxine (EFFEXOR) 75 MG tablet Take 1 tablet by mouth daily.  ? [DISCONTINUED] amlodipine-benazepril (LOTREL) 2.5-10 MG per capsule Take 1 capsule by mouth daily.  ? [DISCONTINUED] b complex vitamins capsule Take 1 capsule by mouth daily. (Patient not taking: Reported on 07/09/2021)  ? [DISCONTINUED] gemfibrozil (LOPID) 600 MG tablet Take 600 mg by mouth 2 (two) times daily before a meal. (Patient not taking: Reported on 07/09/2021)  ? [DISCONTINUED] HYDROcodone-Acetaminophen 5-300 MG TABS Take 1 tablet by mouth every 4 (four) hours as needed.  ? [DISCONTINUED] losartan (COZAAR) 50 MG tablet Take 50 mg by mouth daily. (Patient not taking: Reported on 07/09/2021)  ? ?No facility-administered encounter medications on file as of 07/09/2021.  ? ? ?Follow-up: Return in about 3 months (around 10/09/2021), or if symptoms worsen or fail to improve.  ? ?Information was given on BMI and obesity in adults.  Also information was given on earwax.  We discussed the possibility of changing his diabetes medicines.  He is not on a statin but takes red rice yeast.  Referral to urology for urethral stricture recurrent lithiasis.  Referral for colonoscopy.  Referral for eye check.  Advised that we will have frequent follow-up the time being. ? ?Libby Maw, MD ? ?

## 2021-07-13 ENCOUNTER — Telehealth: Payer: Self-pay | Admitting: Family Medicine

## 2021-07-13 NOTE — Telephone Encounter (Signed)
Returned call no answer LMTCB 

## 2021-07-13 NOTE — Telephone Encounter (Signed)
Pt called inquire about  ?

## 2021-07-13 NOTE — Telephone Encounter (Signed)
Pt was asked to call in to schedule an appointment on Monday for a mychart visit to go over his labs. He does not want to do this. 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 He would like a call back from a nurse to go over his labs. Please advise pt at [phone] ?

## 2021-07-14 ENCOUNTER — Encounter: Payer: Self-pay | Admitting: *Deleted

## 2021-07-14 NOTE — Telephone Encounter (Signed)
Pt has scheduled himself a mychart visit for Friday. He wants to know what Dr. Doreene Burke is planning on doing in this visit. 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 He wants to know if Dr. Doreene Burke is planning on giving him a new prescription. Please advise pt ?

## 2021-07-14 NOTE — Telephone Encounter (Signed)
Returned patients call went over expectations for virtual visit   ?

## 2021-07-17 ENCOUNTER — Telehealth: Payer: Self-pay | Admitting: Family Medicine

## 2021-07-17 ENCOUNTER — Encounter: Payer: Self-pay | Admitting: Family Medicine

## 2021-07-17 ENCOUNTER — Telehealth (INDEPENDENT_AMBULATORY_CARE_PROVIDER_SITE_OTHER): Payer: BC Managed Care – PPO | Admitting: Family Medicine

## 2021-07-17 DIAGNOSIS — E781 Pure hyperglyceridemia: Secondary | ICD-10-CM | POA: Diagnosis not present

## 2021-07-17 DIAGNOSIS — E1165 Type 2 diabetes mellitus with hyperglycemia: Secondary | ICD-10-CM | POA: Diagnosis not present

## 2021-07-17 MED ORDER — FENOFIBRATE 145 MG PO TABS: 145.0000 mg | ORAL_TABLET | Freq: Every day | ORAL | 2 refills | Status: DC

## 2021-07-17 MED ORDER — RYBELSUS 7 MG PO TABS: 7.0000 mg | ORAL_TABLET | Freq: Every day | ORAL | 2 refills | Status: DC

## 2021-07-17 NOTE — Progress Notes (Signed)
Established Patient Office Visit  Subjective:  Patient ID: [name], male    DOB: Oct 03, 1975  Age: 46 y.o. MRN: [id number]  CC:  Chief Complaint  Patient presents with   video visit     Pt discussing labs, and medication changes     HPI [name] presents for follow-up of lab work that had revealed significantly elevated triglycerides and poorly controlled diabetes.  [name] had taken Lipitor for several months but then developed more recent loose stool and it was discontinued in favor of glipizide.  [name] has not been taking the Tricor.  [name] was fasting for his recent lab work.  [name] is taking red rice yeast and his LDL was 75.  Past Medical History:  Diagnosis Date   Hypercholesteremia    Hypertension    Seizure [facility])    as a child    Past Surgical History:  Procedure Laterality Date   arm fracture Left     History reviewed. No pertinent family history.  Social History   Socioeconomic History   Marital status: Married    Spouse name: Not on file   Number of children: Not on file   Years of education: Not on file   Highest education level: Not on file  Occupational History   Not on file  Tobacco Use   Smoking status: Never   Smokeless tobacco: Never  Vaping Use   Vaping Use: Never used  Substance and Sexual Activity   Alcohol use: Yes    Comment: occasioanal   Drug use: Never   Sexual activity: Yes  Other Topics Concern   Not on file  Social History Narrative   Not on file   Social Determinants of Health   Financial Resource Strain: Not on file  Food Insecurity: Not on file  Transportation Needs: Not on file  Physical Activity: Not on file  Stress: Not on file  Social Connections: Not on file  Intimate Partner Violence: Not on file    Outpatient Medications Prior to Visit  Medication Sig Dispense Refill   amLODipine-olmesartan (AZOR) 10-40 MG tablet Take 1 tablet by mouth daily.     ascorbic acid (VITAMIN C) 500 MG tablet Take by mouth.      atenolol (TENORMIN) 50 MG tablet Take 50 mg by mouth 2 (two) times daily.     Cholecalciferol 50 MCG (2000 UT) CAPS Take by mouth.     Garlic 123XX123 MG CAPS Take by mouth.     glipiZIDE (GLUCOTROL) 10 MG tablet Take 10 mg by mouth 2 (two) times daily.     Multiple Vitamin (MULTI VITAMIN DAILY PO) Take by mouth.     Potassium 75 MG TABS Take by mouth.     Red Yeast Rice Extract (RED YEAST RICE PO) Take by mouth.     tamsulosin (FLOMAX) 0.4 MG CAPS capsule Take 1 capsule (0.4 mg total) by mouth daily. 30 capsule 0   UNABLE TO FIND CBD 25 mg tablet, 1 tablet oral daily as needed     venlafaxine (EFFEXOR) 75 MG tablet Take 1 tablet by mouth daily.     fenofibrate (TRICOR) 145 MG tablet Take 1 tablet by mouth daily.     No facility-administered medications prior to visit.    Allergies  Allergen Reactions   Escitalopram Oxalate Nausea And Vomiting    Sleepy   headache   Latex Other (See Comments)    Certain gloves cause itching    Trazodone And Nefazodone    Metformin  And Related Diarrhea    ROS Review of Systems  Constitutional: Negative.   Respiratory: Negative.    Cardiovascular: Negative.   Gastrointestinal: Negative.   Genitourinary: Negative.      Objective:    Physical Exam Vitals and nursing note reviewed.  Constitutional:      Appearance: Normal appearance.  HENT:     Head: Normocephalic and atraumatic.     Right Ear: External ear normal.     Left Ear: External ear normal.  Eyes:     General: No scleral icterus.       Right eye: No discharge.        Left eye: No discharge.     Extraocular Movements: Extraocular movements intact.     Conjunctiva/sclera: Conjunctivae normal.  Pulmonary:     Effort: Pulmonary effort is normal.  Neurological:     Mental Status: [name] is alert and oriented to person, place, and time.  Psychiatric:        Mood and Affect: Mood normal.        Behavior: Behavior normal.    There were no vitals taken for this visit. Wt Readings from  Last 3 Encounters:  07/09/21 234 lb (106.1 kg)  08/14/19 228 lb (103.4 kg)  01/19/13 200 lb (90.7 kg)     Health Maintenance Due  Topic Date Due   FOOT EXAM  Never done   OPHTHALMOLOGY EXAM  Never done   HIV Screening  Never done   Hepatitis C Screening  Never done    There are no preventive care reminders to display for this patient.  No results found for: TSH Lab Results  Component Value Date   WBC 4.2 07/09/2021   HGB 13.1 07/09/2021   HCT 37.1 (L) 07/09/2021   MCV 84.8 07/09/2021   PLT 239.0 07/09/2021   Lab Results  Component Value Date   NA 135 07/09/2021   K 4.0 07/09/2021   CO2 25 07/09/2021   GLUCOSE 264 (H) 07/09/2021   BUN 16 07/09/2021   CREATININE 0.80 07/09/2021   BILITOT 0.5 07/09/2021   ALKPHOS 65 07/09/2021   AST 22 07/09/2021   ALT 39 07/09/2021   PROT 7.3 07/09/2021   ALBUMIN 4.5 07/09/2021   CALCIUM 9.4 07/09/2021   GFR 105.76 07/09/2021   Lab Results  Component Value Date   CHOL 242 (H) 07/09/2021   Lab Results  Component Value Date   HDL 35.20 (L) 07/09/2021   No results found for: [facility] Lab Results  Component Value Date   TRIG (H) 07/09/2021    1175.0 Triglyceride is over 400; calculations on Lipids are invalid.   Lab Results  Component Value Date   CHOLHDL 7 07/09/2021   Lab Results  Component Value Date   HGBA1C 9.3 (H) 07/09/2021      Assessment & Plan:   Problem List Items Addressed This Visit       Other   Hypertriglyceridemia - Primary   Relevant Medications   fenofibrate (TRICOR) 145 MG tablet   Other Visit Diagnoses     Poorly controlled diabetes mellitus ([place])       Relevant Medications   Semaglutide (RYBELSUS) 7 MG TABS       Meds ordered this encounter  Medications   fenofibrate (TRICOR) 145 MG tablet    Sig: Take 1 tablet (145 mg total) by mouth daily.    Dispense:  90 tablet    Refill:  2   Semaglutide (RYBELSUS) 7 MG TABS  Sig: Take 7 mg by mouth daily.    Dispense:  30 tablet     Refill:  2    Follow-up: No follow-ups on file.  Have added Tricor 145 mg and semaglutide Rybelsus 7 mg daily.  Continue the glipizide for now.  Continue red rice yeast but could consider low-dose atorvastatin.  May consider trying extended release metformin.  [name] will check with his pharmacist to see if that is what [name] had been taking.  Discussed risks of hypertriglyceridemia including pancreatitis and hepatic steatosis. Libby Maw, MD  Virtual Visit via Video Note  I connected with [name] on 07/17/21 at  8:40 AM EST by a video enabled telemedicine application and verified that I am speaking with the correct person using two identifiers.  Location: Patient: home with son.  Provider: work   I discussed the limitations of evaluation and management by telemedicine and the availability of in person appointments. The patient expressed understanding and agreed to proceed.  History of Present Illness:    Observations/Objective:   Assessment and Plan:   Follow Up Instructions:    I discussed the assessment and treatment plan with the patient. The patient was provided an opportunity to ask questions and all were answered. The patient agreed with the plan and demonstrated an understanding of the instructions.   The patient was advised to call back or seek an in-person evaluation if the symptoms worsen or if the condition fails to improve as anticipated.  I provided 30 minutes of non-face-to-face time during this encounter.   Libby Maw, MD

## 2021-07-17 NOTE — Telephone Encounter (Signed)
Merry Proud tried to send the info Dr. Ethelene Hal requested through [place] it was not working. He would like a phone call back so he can pass that info on. ?

## 2021-07-18 ENCOUNTER — Encounter: Payer: Self-pay | Admitting: Family Medicine

## 2021-07-20 ENCOUNTER — Telehealth: Payer: Self-pay | Admitting: Family Medicine

## 2021-07-20 NOTE — Telephone Encounter (Signed)
Pt was taking this med Glipizide from last provider and wants to know if DR Doreene Burke would like for him to continue taking this. ?

## 2021-07-20 NOTE — Telephone Encounter (Signed)
Returned call needed clarification on Metformin ?

## 2021-07-20 NOTE — Telephone Encounter (Signed)
Pt is aware and said he would like to start on the XR metformin ?

## 2021-07-21 MED ORDER — METFORMIN HCL ER 500 MG PO TB24: 500.0000 mg | ORAL_TABLET | Freq: Every day | ORAL | 1 refills | Status: DC

## 2021-07-21 NOTE — Addendum Note (Signed)
Addended by: Jon Billings on: 07/21/2021 07:53 AM ? ? Modules accepted: Orders ? ?

## 2021-08-04 ENCOUNTER — Other Ambulatory Visit: Payer: Self-pay

## 2021-08-04 MED ORDER — VENLAFAXINE HCL 75 MG PO TABS: 75.0000 mg | ORAL_TABLET | Freq: Every day | ORAL | 1 refills | Status: DC

## 2021-08-04 NOTE — Telephone Encounter (Signed)
Refill request for pending medication. Np visit 07/09/2021 please advise. ?

## 2021-09-04 LAB — HM DIABETES EYE EXAM

## 2021-10-02 ENCOUNTER — Telehealth: Payer: Self-pay | Admitting: Family Medicine

## 2021-10-02 NOTE — Telephone Encounter (Signed)
Pt has an upcoming appt on 6/2 and would like to clear some things up beforehand. Requesting call back.

## 2021-10-02 NOTE — Telephone Encounter (Signed)
Returned patients called scheduled morning appointment

## 2021-10-09 ENCOUNTER — Ambulatory Visit: Payer: BC Managed Care – PPO | Admitting: Family Medicine

## 2021-10-27 ENCOUNTER — Encounter: Payer: Self-pay | Admitting: Family Medicine

## 2021-10-27 ENCOUNTER — Ambulatory Visit: Payer: BC Managed Care – PPO | Admitting: Family Medicine

## 2021-10-27 VITALS — BP 128/80 | HR 78 | Temp 97.1°F | Ht 69.0 in | Wt 229.0 lb

## 2021-10-27 DIAGNOSIS — E1165 Type 2 diabetes mellitus with hyperglycemia: Secondary | ICD-10-CM | POA: Diagnosis not present

## 2021-10-27 DIAGNOSIS — E781 Pure hyperglyceridemia: Secondary | ICD-10-CM | POA: Diagnosis not present

## 2021-10-27 LAB — BASIC METABOLIC PANEL
BUN: 16 mg/dL (ref 6–23)
CO2: 23 mEq/L (ref 19–32)
Calcium: 9.4 mg/dL (ref 8.4–10.5)
Chloride: 101 mEq/L (ref 96–112)
Creatinine, Ser: 0.81 mg/dL (ref 0.40–1.50)
GFR: 105.15 mL/min (ref >60.00)
Glucose, Bld: 255 mg/dL — ABNORMAL HIGH (ref 70–99)
Potassium: 4.2 mEq/L (ref 3.5–5.1)
Sodium: 134 mEq/L — ABNORMAL LOW (ref 135–145)

## 2021-10-27 LAB — LIPID PANEL
Cholesterol: 222 mg/dL — ABNORMAL HIGH (ref 0–200)
HDL: 38.8 mg/dL — ABNORMAL LOW (ref >39.00)
Total CHOL/HDL Ratio: 6
Triglycerides: 989 mg/dL — ABNORMAL HIGH (ref 0.0–149.0)

## 2021-10-27 LAB — HEMOGLOBIN A1C: Hgb A1c MFr Bld: 9.4 % — ABNORMAL HIGH (ref 4.6–6.5)

## 2021-10-27 LAB — LDL CHOLESTEROL, DIRECT: Direct LDL: 84 mg/dL

## 2021-10-27 MED ORDER — RYBELSUS 7 MG PO TABS: 7.0000 mg | ORAL_TABLET | Freq: Every day | ORAL | 2 refills | Status: DC

## 2021-10-27 MED ORDER — METFORMIN HCL ER 500 MG PO TB24: 500.0000 mg | ORAL_TABLET | Freq: Two times a day (BID) | ORAL | 1 refills | Status: DC

## 2021-10-27 MED ORDER — FENOFIBRATE 145 MG PO TABS: 145.0000 mg | ORAL_TABLET | Freq: Every day | ORAL | 2 refills | Status: AC

## 2021-10-27 NOTE — Progress Notes (Addendum)
Established Patient Office Visit  Subjective   Patient ID: [name], male    DOB: [birth year]  Age: 46 y.o. MRN: [id number]  Chief Complaint  Patient presents with   Follow-up    3 month follow up, discuss medications being decreased. Patient fasting.     HPI for follow-up of elevated triglycerides and poorly controlled type 2 diabetes continues with glipizide twice daily.  He has been taking the Rybelsus intermittently.  Fortunately he is tolerating the extended release form of metformin.  He wonders if he could get off of some of these medications.  Last hemoglobin A1c was 9.4.  He now has been compliant with his fenofibrate.  He understands that he is at risk for pancreatitis.  He will need fenofibrate most likely for the remainder of his life.  Elevated triglycerides run in the family    Review of Systems  Constitutional: Negative.   HENT: Negative.    Eyes:  Negative for blurred vision, discharge and redness.  Respiratory: Negative.    Cardiovascular: Negative.   Gastrointestinal:  Negative for abdominal pain.  Genitourinary: Negative.   Musculoskeletal: Negative.  Negative for myalgias.  Skin:  Negative for rash.  Neurological:  Negative for tingling, loss of consciousness and weakness.  Endo/Heme/Allergies:  Negative for polydipsia.      Objective:     BP 128/80 (BP Location: Right Arm, Patient Position: Sitting, Cuff Size: Large)   Pulse 78   Temp (!) 97.1 F (36.2 C) (Temporal)   Ht 5\' 9"  (1.753 m)   Wt 229 lb (103.9 kg)   SpO2 99%   BMI 33.82 kg/m  Wt Readings from Last 3 Encounters:  10/27/21 229 lb (103.9 kg)  07/09/21 234 lb (106.1 kg)  08/14/19 228 lb (103.4 kg)      Physical Exam Constitutional:      General: He is not in acute distress.    Appearance: Normal appearance. He is not ill-appearing, toxic-appearing or diaphoretic.  HENT:     Head: Normocephalic and atraumatic.     Right Ear: External ear normal.     Left Ear: External ear  normal.     Mouth/Throat:     Mouth: Mucous membranes are moist.     Pharynx: Oropharynx is clear. No oropharyngeal exudate or posterior oropharyngeal erythema.  Eyes:     General: No scleral icterus.       Right eye: No discharge.        Left eye: No discharge.     Extraocular Movements: Extraocular movements intact.     Conjunctiva/sclera: Conjunctivae normal.     Pupils: Pupils are equal, round, and reactive to light.  Cardiovascular:     Rate and Rhythm: Normal rate and regular rhythm.     Pulses:          Dorsalis pedis pulses are 2+ on the right side and 2+ on the left side.       Posterior tibial pulses are 1+ on the right side and 1+ on the left side.  Pulmonary:     Effort: Pulmonary effort is normal. No respiratory distress.     Breath sounds: Normal breath sounds.  Abdominal:     General: Bowel sounds are normal.     Tenderness: There is no abdominal tenderness. There is no guarding.  Musculoskeletal:     Cervical back: No rigidity or tenderness.  Skin:    General: Skin is warm and dry.  Neurological:     Mental Status: He is  alert and oriented to person, place, and time.  Psychiatric:        Mood and Affect: Mood normal.        Behavior: Behavior normal.    Diabetic Foot Exam - Simple   Simple Foot Form Diabetic Foot exam was performed with the following findings: Yes 10/27/2021  8:37 AM  Visual Inspection See comments: Yes Sensation Testing Intact to touch and monofilament testing bilaterally: Yes Pulse Check Posterior Tibialis and Dorsalis pulse intact bilaterally: Yes Comments Feet are cavus bilaterally without lesions or ulcers.       No results found for any visits on 10/27/21.    The 10-year ASCVD risk score (Arnett DK, et al., 2019) is: 11.2%    Assessment & Plan:   Problem List Items Addressed This Visit       Other   Hypertriglyceridemia   Relevant Medications   fenofibrate (TRICOR) 145 MG tablet   Other Relevant Orders   Lipid  panel   Other Visit Diagnoses     Poorly controlled diabetes mellitus ([facility])    -  Primary   Relevant Medications   metFORMIN (GLUCOPHAGE-XR) 500 MG 24 hr tablet   Semaglutide (RYBELSUS) 7 MG TABS   Other Relevant Orders   Basic metabolic panel   Hemoglobin A1c       Return in about 3 months (around 01/27/2022).  Have discontinued glipizide.  Patient will take his Rybelsus daily.  Have increased the metformin XL 500 mg to twice daily.  Advised 30 minutes of sustained exercise such as walking daily.  Reminded him that his food choices make a difference.  Advised him to avoid all sweets he drinks and sweets and general.  Return fasting in 3 months.  Mliss Sax, MD   01/19/22 addendum: Patient's wife lost her job and they no longer have health insurance.  Cannot afford the semaglutide.  Have increased metformin to 500 mg twice daily.  Have added glipizide 10 mg before every meal twice daily back to his regimen.  Have changed from Azor to amlodipine 10 mg daily with irbesartan 150 mg daily.  Olmesartan is not on the discount list.  He must return to clinic in 3 months for follow-up.

## 2021-11-03 ENCOUNTER — Encounter: Payer: Self-pay | Admitting: Family Medicine

## 2021-11-22 ENCOUNTER — Encounter: Payer: Self-pay | Admitting: Family Medicine

## 2022-01-18 ENCOUNTER — Telehealth: Payer: Self-pay | Admitting: Family Medicine

## 2022-01-18 DIAGNOSIS — I10 Essential (primary) hypertension: Secondary | ICD-10-CM

## 2022-01-18 MED ORDER — AMLODIPINE-OLMESARTAN 10-40 MG PO TABS: 1.0000 | ORAL_TABLET | Freq: Every day | ORAL | 3 refills | Status: DC

## 2022-01-18 NOTE — Telephone Encounter (Signed)
Caller Name: Emma Schupp Call back phone #: [phone]  Reason for Call: Pt's wife has lost job/insurance. Pt needs two refills however the price without insurance is 1100$ for one. He has been without since Saturday and would like a  call back to go over options.

## 2022-01-18 NOTE — Telephone Encounter (Signed)
Patient calling for refill on amlodipine states that he is currently uninsured due to wife losing job patient needing refill on Rybelsus states the out of pocket cost is $1100 would like something cheaper that he can afford with no insurance. Patient also asking for refill on Zofran. Please advise.

## 2022-01-19 MED ORDER — GLIPIZIDE 10 MG PO TABS: 10.0000 mg | ORAL_TABLET | Freq: Two times a day (BID) | ORAL | 3 refills | Status: AC

## 2022-01-19 MED ORDER — IRBESARTAN 150 MG PO TABS: 150.0000 mg | ORAL_TABLET | Freq: Every day | ORAL | 2 refills | Status: DC

## 2022-01-19 MED ORDER — METFORMIN HCL ER 500 MG PO TB24: 500.0000 mg | ORAL_TABLET | Freq: Every day | ORAL | 2 refills | Status: DC

## 2022-01-19 MED ORDER — AMLODIPINE BESYLATE 10 MG PO TABS: 10.0000 mg | ORAL_TABLET | Freq: Every day | ORAL | 2 refills | Status: DC

## 2022-01-19 NOTE — Telephone Encounter (Signed)
LVM with provider recommendations, med changes, approval to [facility] to Dr. Janee Morn. Advised pt to call back if I can be of further assistance.

## 2022-01-19 NOTE — Telephone Encounter (Signed)
Caller Name: [facility] Call back phone #: [phone]  Reason for Call: Pharmacy called because there had been two instructions sent in for the Metformin. Requested for a call back or for the prescription to be resubmitted with clarification

## 2022-01-19 NOTE — Telephone Encounter (Addendum)
Pt requested call from manager. I spent approximately 15 minutes on a call with him.  Pt had to cancel 9/20 follow up appointment due to financial situation without insurance and wife lost her job. They are trying to get insurance through marketplace but won't have it until October at least.  Pt is concerned about being without meds due to loss of insurance right now. Is there an alternative med for Rybelsus? He has lost weight since taking and is down to 212 lbs. $1000 w/o insurance, and $900 with [employer] coupon and he cannot afford it.  Also needing Amlodipine/Olmesartan RX to be changed. It is $794.88 for 90 day supply from [facility]. He said he cannot afford.   I spoke with Katrina, pharm tech at [facility], and she said Amlodipine 10mg  is $24 for #90. Losartan 50mg  is $24 for #90. If these two as separate meds would be an alternative  Pt states there were conversations about his pancreas in the past. He said he was never referred out for that. Did a referral need to be placed for him?  Patient states maybe patient-provider relationship is not the best fit for him and requests to see another MD within the office and has asked for [facility] to Dr. for future visit.  Please let me know how I can help.

## 2022-01-19 NOTE — Addendum Note (Signed)
Addended by: Nadene Rubins A on: 01/19/2022 11:17 AM   Modules accepted: Orders

## 2022-01-25 ENCOUNTER — Telehealth: Payer: Self-pay | Admitting: Family Medicine

## 2022-01-25 NOTE — Telephone Encounter (Signed)
Pharmacist from [facility] is calling to clarify the directions for the Metformin sent over on 01/19/22. He says there are two sets of directions.  [facility] [address] [place], [place] - [address]  [address], [place] [address]  Phone:  [phone]  Fax:  [phone]  Please advise

## 2022-01-26 NOTE — Telephone Encounter (Signed)
Please advise message below pharmacist calling for clarity on Metformin direction say take once a day and also take twice a day. Which should patient be taking?

## 2022-01-26 NOTE — Telephone Encounter (Signed)
Pharmacy aware of message below

## 2022-01-27 ENCOUNTER — Ambulatory Visit: Payer: BC Managed Care – PPO | Admitting: Family Medicine

## 2022-02-11 ENCOUNTER — Encounter: Payer: Self-pay | Admitting: Family Medicine

## 2022-04-14 ENCOUNTER — Telehealth: Payer: Self-pay | Admitting: Family Medicine

## 2022-04-14 DIAGNOSIS — E1165 Type 2 diabetes mellitus with hyperglycemia: Secondary | ICD-10-CM

## 2022-04-14 NOTE — Telephone Encounter (Signed)
Patient called in regarding his metFORMIN (GLUCOPHAGE-XR) 500 MG 24 hr tablet; He just realized that the signature states Take 1 tablet (500 mg total) by mouth daily with breakfast. Take 1 tablet twice daily. Marland Kitchen He states he has only been taking one tablet. He inquired when the second tablet should be taken if the 1st one is taken with breakfast. Please advise.

## 2022-04-14 NOTE — Telephone Encounter (Signed)
Pt want to know if you can give him a call . Pt has appointment questions and meds.

## 2022-04-15 MED ORDER — METFORMIN HCL ER 500 MG PO TB24: 500.0000 mg | ORAL_TABLET | Freq: Two times a day (BID) | ORAL | 2 refills | Status: AC

## 2022-04-15 NOTE — Telephone Encounter (Signed)
Patient is aware of annotation below and verbalized understanding.  

## 2022-04-15 NOTE — Addendum Note (Signed)
Addended by: Andrez Grime on: 04/15/2022 08:07 AM   Modules accepted: Orders

## 2022-04-15 NOTE — Telephone Encounter (Signed)
Caller Name: [name] Call back phone #: [phone]  Reason for Call: Please call pt, he did not want to include a message.

## 2022-05-09 ENCOUNTER — Other Ambulatory Visit: Payer: Self-pay | Admitting: Family Medicine

## 2022-05-09 DIAGNOSIS — E1165 Type 2 diabetes mellitus with hyperglycemia: Secondary | ICD-10-CM

## 2022-05-09 DIAGNOSIS — E781 Pure hyperglyceridemia: Secondary | ICD-10-CM

## 2022-05-14 ENCOUNTER — Telehealth: Payer: Self-pay | Admitting: Family Medicine

## 2022-05-14 NOTE — Telephone Encounter (Signed)
Pt has a physical scheduled for 05/17/22, Monday at 8:20. He was told to fast for labs. He is saying he needs to take his meds with food. Please advise pt at   [phone] [facility])

## 2022-05-14 NOTE — Telephone Encounter (Signed)
Patient is aware of annotation below and agreed with your recommendations and verbalized understanding.

## 2022-05-17 ENCOUNTER — Encounter: Payer: Self-pay | Admitting: Family Medicine

## 2022-05-17 ENCOUNTER — Telehealth: Payer: Self-pay

## 2022-05-17 ENCOUNTER — Ambulatory Visit (INDEPENDENT_AMBULATORY_CARE_PROVIDER_SITE_OTHER): Payer: BLUE CROSS/BLUE SHIELD | Admitting: Family Medicine

## 2022-05-17 VITALS — BP 134/84 | HR 73 | Temp 97.8°F | Wt 211.8 lb

## 2022-05-17 DIAGNOSIS — G4709 Other insomnia: Secondary | ICD-10-CM

## 2022-05-17 DIAGNOSIS — E781 Pure hyperglyceridemia: Secondary | ICD-10-CM

## 2022-05-17 DIAGNOSIS — E1165 Type 2 diabetes mellitus with hyperglycemia: Secondary | ICD-10-CM

## 2022-05-17 DIAGNOSIS — I10 Essential (primary) hypertension: Secondary | ICD-10-CM

## 2022-05-17 DIAGNOSIS — G4700 Insomnia, unspecified: Secondary | ICD-10-CM

## 2022-05-17 DIAGNOSIS — F330 Major depressive disorder, recurrent, mild: Secondary | ICD-10-CM

## 2022-05-17 DIAGNOSIS — Z6831 Body mass index (BMI) 31.0-31.9, adult: Secondary | ICD-10-CM

## 2022-05-17 DIAGNOSIS — F419 Anxiety disorder, unspecified: Secondary | ICD-10-CM

## 2022-05-17 DIAGNOSIS — E669 Obesity, unspecified: Secondary | ICD-10-CM

## 2022-05-17 MED ORDER — DOXEPIN HCL 10 MG PO CAPS: 10.0000 mg | ORAL_CAPSULE | Freq: Every day | ORAL | 0 refills | Status: DC

## 2022-05-17 NOTE — Patient Instructions (Addendum)
Start doxepin. See handout for sleep hygene

## 2022-05-17 NOTE — Progress Notes (Signed)
Chief Complaint:  [name] is a 47 y.o. male who presents today for his annual comprehensive physical exam.    Assessment/Plan:   Problem List Items Addressed This Visit       Cardiovascular and Mediastinum   Essential hypertension    Controlled on irbesartan, amlodipine, atenolol continue current management, reinforce lifestyle modifications, and monitor blood pressure levels to ensure they remain within target ranges.        Endocrine   Type 2 diabetes mellitus with hyperglycemia (HCC)    A1C was around 9, indicating poorly controlled diabetes. Plan: Reinforce diabetes self-management education, ensure adherence to medication regimen, recommend dietary consultation for diabetic diet, and schedule follow-up for medication management review.        Other   Class 1 obesity with serious comorbidity and body mass index (BMI) of 31.0 to 31.9 in adult    Weight loss noted, which is favorable. Plan: Encourage continuation of current efforts and provide resources for weight management, consider dietary consultation.      Anxiety    Patient reports high stress due to personal circumstances including spouse's unemployment. Plan: Continue therapy sessions as they are helpful, assess for need for anxiolytics or antidepressants, and consider referral to a mental health specialist for further evaluation.      Hypertriglyceridemia    Reportedly shocked by high triglyceride levels; family history of dyslipidemia. Plan: Emphasize lifestyle modifications including dietary changes, potential medication adjustment after reevaluation, and routine lipid panel monitoring.      Persistent disorder of initiating or maintaining sleep    Patient reports interrupted sleep, has history of sleep apnea. Plan: Discuss sleep hygiene principles, consider low dose Doxepin for insomnia, and reassess need for sleep apnea management (potential CPAP or Inspire device).      Other Visit Diagnoses      Other insomnia    -  Primary   MDD (major depressive disorder), recurrent episode, mild (HCC)          Medications Discontinued During This Encounter  Medication Reason   venlafaxine (EFFEXOR) 75 MG tablet      Patient Counseling(The following topics were reviewed and/or handout was given):  -Nutrition: Stressed importance of moderation in sodium/caffeine intake, saturated fat and cholesterol, caloric balance, sufficient intake of fresh fruits, vegetables, and fiber.  -Stressed the importance of regular exercise.   -Substance Abuse: Discussed cessation/primary prevention of tobacco, alcohol, or other drug use; driving or other dangerous activities under the influence; availability of treatment for abuse.   -Injury prevention: Discussed safety belts, safety helmets, smoke detector, smoking near bedding or upholstery.   -Sexuality: Discussed sexually transmitted diseases, partner selection, use of condoms, avoidance of unintended pregnancy and contraceptive alternatives.   -Dental health: Discussed importance of regular tooth brushing, flossing, and dental visits.  -Health maintenance and immunizations reviewed. Please refer to Health maintenance section.  Return to care in 1 year for next preventative visit.     Subjective:  HPI:  Chief complaint: The patient has no acute complaints today, just here for physical. HPI: The patient expresses concern about stress due to financial strain. Sleep quality is reduced. Discusses medication for diabetes and the possibility of alternatives. The patient is actively managing stress and considering lifestyle changes including yoga. Review of Systems:  Specific concerns related to diabetes management, hypertriglyceridemia, hypertension, stress, anxiety, sleep disturbance, and obesity have been reviewed. ROS is otherwise negative. Lifestyle:  Diet and exercise have been discussed. The patient is not engaging in regular exercise but  has plans to  become more active and start yoga. Health Maintenance:  Dental and Vision are up-to-date. Recommendations for upcoming screenings such as HIV and Hepatitis C are provided. Diabetes management follow-up is scheduled.     05/17/2022    9:16 AM  Depression screen PHQ 2/9  Decreased Interest 0  Down, Depressed, Hopeless 1  PHQ - 2 Score 1  Altered sleeping 3  Tired, decreased energy 3  Change in appetite 1  Feeling bad or failure about yourself  1  Trouble concentrating 0  Moving slowly or fidgety/restless 0  Suicidal thoughts 0  PHQ-9 Score 9  Difficult doing work/chores Not difficult at all    Health Maintenance Due  Topic Date Due   HIV Screening  Never done   Hepatitis C Screening  Never done   HEMOGLOBIN A1C  04/28/2022     PMH:  The following were reviewed and entered/updated in epic: Past Medical History:  Diagnosis Date   Hypercholesteremia    Hypertension    Seizure (HCC)    as a child   Patient Active Problem List   Diagnosis Date Noted   Persistent disorder of initiating or maintaining sleep 05/22/2022   Hypertriglyceridemia 07/17/2021   Anxiety 07/09/2021   Dysthymia 07/09/2021   Impacted cerumen of left ear 07/09/2021   Encounter for screening colonoscopy 07/09/2021   Kidney stones 07/09/2021   Urethral stricture 06/19/2020   Left ureteral stone 06/11/2020   Normocytic anemia 09/19/2019   Mixed hyperlipidemia 06/27/2019   Type 2 diabetes mellitus with hyperglycemia (HCC) 06/27/2019   OSA (obstructive sleep apnea) 10/28/2018   Essential hypertension 06/21/2018   Class 1 obesity with serious comorbidity and body mass index (BMI) of 31.0 to 31.9 in adult 06/21/2018   Ganglion cyst of wrist, right 09/27/2017   Past Surgical History:  Procedure Laterality Date   arm fracture Left     No family history on file.  Medications- reviewed and updated Current Outpatient Medications  Medication Sig Dispense Refill   amLODipine (NORVASC) 10 MG tablet  Take 1 tablet by mouth once daily 30 tablet 0   ascorbic acid (VITAMIN C) 500 MG tablet Take by mouth.     atenolol (TENORMIN) 50 MG tablet Take 50 mg by mouth 2 (two) times daily.     Cholecalciferol 50 MCG (2000 UT) CAPS Take by mouth.     fenofibrate (TRICOR) 145 MG tablet Take 1 tablet (145 mg total) by mouth daily. 90 tablet 2   glipiZIDE (GLUCOTROL) 10 MG tablet Take 1 tablet (10 mg total) by mouth 2 (two) times daily before a meal. 60 tablet 3   irbesartan (AVAPRO) 150 MG tablet Take 1 tablet by mouth once daily 30 tablet 0   metFORMIN (GLUCOPHAGE-XR) 500 MG 24 hr tablet Take 1 tablet (500 mg total) by mouth 2 (two) times daily with a meal. Take 1 tablet twice daily. 60 tablet 2   Multiple Vitamin (MULTI VITAMIN DAILY PO) Take by mouth.     Omega-3 Fatty Acids (FISH OIL) 1200 MG CAPS Take by mouth in the morning and at bedtime. 2,400 daily.     UNABLE TO FIND CBD 25 mg tablet, 1 tablet oral daily as needed     gabapentin (NEURONTIN) 100 MG capsule Take 1 capsule (100 mg total) by mouth at bedtime as needed (insomnia). 30 capsule 0   Garlic 1000 MG CAPS Take by mouth. (Patient not taking: Reported on 05/17/2022)     Potassium 75 MG TABS Take by  mouth. (Patient not taking: Reported on 10/27/2021)     Red Yeast Rice Extract (RED YEAST RICE PO) Take by mouth. (Patient not taking: Reported on 05/17/2022)     tamsulosin (FLOMAX) 0.4 MG CAPS capsule Take 1 capsule (0.4 mg total) by mouth daily. (Patient not taking: Reported on 05/17/2022) 30 capsule 0   No current facility-administered medications for this visit.    Allergies-reviewed and updated Allergies  Allergen Reactions   Escitalopram Oxalate Nausea And Vomiting    Sleepy   headache   Latex Other (See Comments)    Certain gloves cause itching    Trazodone And Nefazodone    Metformin And Related Diarrhea    Social History   Socioeconomic History   Marital status: Married    Spouse name: Not on file   Number of children: Not on file    Years of education: Not on file   Highest education level: Not on file  Occupational History   Not on file  Tobacco Use   Smoking status: Never   Smokeless tobacco: Never  Vaping Use   Vaping Use: Never used  Substance and Sexual Activity   Alcohol use: Yes    Comment: occasioanal   Drug use: Never   Sexual activity: Yes  Other Topics Concern   Not on file  Social History Narrative   Not on file   Social Determinants of Health   Financial Resource Strain: Not on file  Food Insecurity: Not on file  Transportation Needs: Not on file  Physical Activity: Not on file  Stress: Not on file  Social Connections: Not on file        Objective:  Physical Exam: BP 134/84 (BP Location: Left Arm, Patient Position: Sitting, Cuff Size: Large)   Pulse 73   Temp 97.8 F (36.6 C) (Temporal)   Wt 211 lb 12.8 oz (96.1 kg)   SpO2 99%   BMI 31.28 kg/m   Body mass index is 31.28 kg/m. Wt Readings from Last 3 Encounters:  05/17/22 211 lb 12.8 oz (96.1 kg)  10/27/21 229 lb (103.9 kg)  07/09/21 234 lb (106.1 kg)    Gen: NAD, resting comfortably CV: RRR with no murmurs appreciated Pulm: NWOB, CTAB with no crackles, wheezes, or rhonchi GI: Normal bowel sounds present. Soft, Nontender, Nondistended. MSK: no edema, cyanosis, or clubbing noted Skin: warm, dry Neuro: grossly normal, moves all extremities Psych: Normal affect and thought content      At today's visit, we discussed treatment options, associated risk and benefits, and engage in counseling as needed.  Additionally the following were reviewed: Past medical records, past medical and surgical history, family and social background, as well as relevant laboratory results, imaging findings, and specialty notes, where applicable.  This message was generated using dictation software, and as a result, it may contain unintentional typos or errors.  Nevertheless, extensive effort was made to accurately convey at the pertinent aspects  of the patient visit.    There may have been are other unrelated non-urgent complaints, but due to the busy schedule and the amount of time already spent with him, time does not permit to address these issues at today's visit. Another appointment may have or has been requested to review these additional issues.   Marny Lowenstein, MD, MS

## 2022-05-17 NOTE — Telephone Encounter (Signed)
Please advise how soon you would like for patient to follow up for sleep, mood, Diabetes, cholesterol.

## 2022-05-18 NOTE — Telephone Encounter (Signed)
Left patient a detailed voice message advising patient to call back to schedule 1 month follow up

## 2022-05-20 ENCOUNTER — Telehealth: Payer: Self-pay | Admitting: Family Medicine

## 2022-05-20 DIAGNOSIS — G4709 Other insomnia: Secondary | ICD-10-CM

## 2022-05-20 MED ORDER — GABAPENTIN 100 MG PO CAPS: 100.0000 mg | ORAL_CAPSULE | Freq: Every evening | ORAL | 0 refills | Status: DC | PRN

## 2022-05-20 NOTE — Telephone Encounter (Signed)
Patient is aware of annotation below and verbalized understanding. He mentioned taking a benadryl for sleep occasionally and I advised him that if gabapentin doesn't help we can schedule a follow up.

## 2022-05-20 NOTE — Telephone Encounter (Signed)
Patient states that after starting doxepin (SINEQUAN) 10 MG capsule on 05/17/2022 the following day he felt groggy all day. He inquired is this a normal effect of the medication or should he start something different?

## 2022-05-20 NOTE — Addendum Note (Signed)
Addended by: Josephine Igo B on: 05/20/2022 04:41 PM   Modules accepted: Orders

## 2022-05-20 NOTE — Telephone Encounter (Signed)
Pt would like for you to give him a call about appt. Before he schedule one with [place]

## 2022-05-22 DIAGNOSIS — G4700 Insomnia, unspecified: Secondary | ICD-10-CM | POA: Insufficient documentation

## 2022-05-22 NOTE — Assessment & Plan Note (Signed)
Controlled on irbesartan, amlodipine, atenolol continue current management, reinforce lifestyle modifications, and monitor blood pressure levels to ensure they remain within target ranges.

## 2022-05-22 NOTE — Assessment & Plan Note (Signed)
Reportedly shocked by high triglyceride levels; family history of dyslipidemia. Plan: Emphasize lifestyle modifications including dietary changes, potential medication adjustment after reevaluation, and routine lipid panel monitoring.

## 2022-05-22 NOTE — Assessment & Plan Note (Signed)
A1C was around 9, indicating poorly controlled diabetes. Plan: Reinforce diabetes self-management education, ensure adherence to medication regimen, recommend dietary consultation for diabetic diet, and schedule follow-up for medication management review.

## 2022-05-22 NOTE — Assessment & Plan Note (Signed)
Patient reports high stress due to personal circumstances including spouse's unemployment. Plan: Continue therapy sessions as they are helpful, assess for need for anxiolytics or antidepressants, and consider referral to a mental health specialist for further evaluation.

## 2022-05-22 NOTE — Assessment & Plan Note (Signed)
Weight loss noted, which is favorable. Plan: Encourage continuation of current efforts and provide resources for weight management, consider dietary consultation.

## 2022-05-22 NOTE — Assessment & Plan Note (Signed)
Patient reports interrupted sleep, has history of sleep apnea. Plan: Discuss sleep hygiene principles, consider low dose Doxepin for insomnia, and reassess need for sleep apnea management (potential CPAP or Inspire device).

## 2022-05-31 ENCOUNTER — Telehealth: Payer: Self-pay | Admitting: Family Medicine

## 2022-05-31 MED ORDER — ATENOLOL 50 MG PO TABS: 50.0000 mg | ORAL_TABLET | Freq: Two times a day (BID) | ORAL | 0 refills | Status: DC

## 2022-05-31 NOTE — Telephone Encounter (Signed)
Left patient a detailed voice message advising him that his rx refill has been sent and to cb with any other concerns.   Chart supports rx. Last OV: 05/17/2022 Next OV: 06/21/2022

## 2022-05-31 NOTE — Telephone Encounter (Signed)
Caller Name: pt Call back phone #: [phone]   Reason for Call: pt needs his atenolo refilled he is out and needs a pill tonight.

## 2022-06-10 ENCOUNTER — Other Ambulatory Visit: Payer: Self-pay | Admitting: Family Medicine

## 2022-06-10 DIAGNOSIS — E1165 Type 2 diabetes mellitus with hyperglycemia: Secondary | ICD-10-CM

## 2022-06-10 DIAGNOSIS — E781 Pure hyperglyceridemia: Secondary | ICD-10-CM

## 2022-06-14 ENCOUNTER — Telehealth: Payer: Self-pay | Admitting: Family Medicine

## 2022-06-14 DIAGNOSIS — E781 Pure hyperglyceridemia: Secondary | ICD-10-CM

## 2022-06-14 DIAGNOSIS — E1165 Type 2 diabetes mellitus with hyperglycemia: Secondary | ICD-10-CM

## 2022-06-14 NOTE — Telephone Encounter (Signed)
Pt will be transferring to [facility] due to [facility] Local plan. Removed provider

## 2022-06-21 ENCOUNTER — Ambulatory Visit: Payer: BLUE CROSS/BLUE SHIELD | Admitting: Family Medicine

## 2022-06-22 NOTE — Telephone Encounter (Signed)
Patient called back and asked for a call back from the office manager, He said its about the incident he talked with her about last week. Call back is [phone]

## 2022-06-25 NOTE — Telephone Encounter (Signed)
Pt has scheduled appt 07/22/2022 with a new PCP at [facility] due to insurance being OON with [facility].  Pt requesting 30 day refill of medications below to cover until his appt.   glipiZIDE (GLUCOTROL) 10 MG tablet - few days left  metFORMIN (GLUCOPHAGE-XR) 500 MG 24 hr tablet - few days left  atenolol (TENORMIN) 50 MG tablet  - 1 week left  irbesartan (AVAPRO) 150 MG tablet - 2 weeks left  amLODipine (NORVASC) 10 MG tablet - 2 weeks left   [facility] [address] [place], [place] - [address] [place] [address] Phone: [phone]  Fax: [phone]

## 2022-06-25 NOTE — Telephone Encounter (Signed)
Chart supports rx. Last OV: 05/17/2022  Rx pended for approval

## 2022-07-07 NOTE — Telephone Encounter (Signed)
Patient called in requesting to speak with [name] to follow up with an insurance issue. He stated that he was told back in December of 2023 that his insurance would be accepted and then in January he was told it wasn't. He stated that he received a text stating that a balance is due. Patient requested a call back. I advised him that you were out of the office for the afternoon , but I would send a message to you regarding the issue.

## 2022-07-07 NOTE — Telephone Encounter (Signed)
Report from Kendrick Fries that pt called in upset and yellow. She got his number [phone]. I am out of office for the afternoon. Please call pt to follow up on below request with an update.

## 2022-07-09 ENCOUNTER — Other Ambulatory Visit: Payer: Self-pay | Admitting: Family Medicine

## 2022-07-09 NOTE — Telephone Encounter (Signed)
LVM for pt to call back.

## 2022-07-09 NOTE — Telephone Encounter (Signed)
Spoke to pt about Jan 2024 visit and that I am unable to reduce payment. Pt states he was misinformed by [facility] and by our office that we were in network. Advised pt that pt responsible for determining provider in network and I am not able to do this. Pt was advised to reach out to billing dept/mgr if need be. [facility] staff did not misinform pt but he states he was not aware.

## 2022-07-10 ENCOUNTER — Other Ambulatory Visit: Payer: Self-pay | Admitting: Family Medicine

## 2022-07-11 ENCOUNTER — Other Ambulatory Visit: Payer: Self-pay | Admitting: Family Medicine

## 2022-07-11 DIAGNOSIS — E1165 Type 2 diabetes mellitus with hyperglycemia: Secondary | ICD-10-CM

## 2022-07-11 DIAGNOSIS — E781 Pure hyperglyceridemia: Secondary | ICD-10-CM

## 2022-07-12 ENCOUNTER — Other Ambulatory Visit: Payer: Self-pay | Admitting: Family Medicine

## 2022-07-12 DIAGNOSIS — E781 Pure hyperglyceridemia: Secondary | ICD-10-CM

## 2022-07-30 NOTE — Telephone Encounter (Signed)
Denman George, are you able to close/sign encounter?

## 2023-03-05 ENCOUNTER — Other Ambulatory Visit: Payer: Self-pay | Admitting: Family Medicine

## 2023-03-05 DIAGNOSIS — E1165 Type 2 diabetes mellitus with hyperglycemia: Secondary | ICD-10-CM

## 2023-08-22 ENCOUNTER — Observation Stay (HOSPITAL_COMMUNITY)

## 2023-08-22 ENCOUNTER — Other Ambulatory Visit: Payer: Self-pay | Admitting: Urology

## 2023-08-22 ENCOUNTER — Observation Stay (HOSPITAL_COMMUNITY)
Admission: EM | Admit: 2023-08-22 | Discharge: 2023-08-23 | Disposition: A | Discharge status: Home / Self Care | Attending: Internal Medicine | Admitting: Internal Medicine

## 2023-08-22 ENCOUNTER — Encounter (HOSPITAL_COMMUNITY): Payer: Self-pay | Admitting: Emergency Medicine

## 2023-08-22 ENCOUNTER — Other Ambulatory Visit: Payer: Self-pay

## 2023-08-22 ENCOUNTER — Encounter (HOSPITAL_COMMUNITY)
Admission: EM | Disposition: A | Discharge status: Home / Self Care | Payer: Self-pay | Source: Home / Self Care | Attending: Emergency Medicine

## 2023-08-22 ENCOUNTER — Emergency Department (HOSPITAL_COMMUNITY)

## 2023-08-22 ENCOUNTER — Observation Stay (HOSPITAL_BASED_OUTPATIENT_CLINIC_OR_DEPARTMENT_OTHER)

## 2023-08-22 DIAGNOSIS — I10 Essential (primary) hypertension: Secondary | ICD-10-CM | POA: Diagnosis not present

## 2023-08-22 DIAGNOSIS — E1165 Type 2 diabetes mellitus with hyperglycemia: Secondary | ICD-10-CM | POA: Diagnosis not present

## 2023-08-22 DIAGNOSIS — N202 Calculus of kidney with calculus of ureter: Secondary | ICD-10-CM | POA: Diagnosis not present

## 2023-08-22 DIAGNOSIS — G4733 Obstructive sleep apnea (adult) (pediatric): Secondary | ICD-10-CM

## 2023-08-22 DIAGNOSIS — Z91040 Latex allergy status: Secondary | ICD-10-CM | POA: Diagnosis not present

## 2023-08-22 DIAGNOSIS — N35812 Other urethral bulbous stricture, male: Principal | ICD-10-CM | POA: Insufficient documentation

## 2023-08-22 DIAGNOSIS — Z7984 Long term (current) use of oral hypoglycemic drugs: Secondary | ICD-10-CM | POA: Insufficient documentation

## 2023-08-22 DIAGNOSIS — N179 Acute kidney failure, unspecified: Secondary | ICD-10-CM | POA: Diagnosis present

## 2023-08-22 DIAGNOSIS — N1330 Unspecified hydronephrosis: Secondary | ICD-10-CM | POA: Diagnosis not present

## 2023-08-22 DIAGNOSIS — N139 Obstructive and reflux uropathy, unspecified: Secondary | ICD-10-CM | POA: Diagnosis present

## 2023-08-22 DIAGNOSIS — N132 Hydronephrosis with renal and ureteral calculous obstruction: Secondary | ICD-10-CM | POA: Diagnosis not present

## 2023-08-22 DIAGNOSIS — F419 Anxiety disorder, unspecified: Secondary | ICD-10-CM | POA: Diagnosis present

## 2023-08-22 DIAGNOSIS — Z79899 Other long term (current) drug therapy: Secondary | ICD-10-CM | POA: Diagnosis not present

## 2023-08-22 DIAGNOSIS — N200 Calculus of kidney: Principal | ICD-10-CM

## 2023-08-22 HISTORY — DX: Sleep apnea, unspecified: G47.30

## 2023-08-22 HISTORY — DX: Type 2 diabetes mellitus without complications: E11.9

## 2023-08-22 HISTORY — DX: Personal history of urinary calculi: Z87.442

## 2023-08-22 HISTORY — PX: CYSTOSCOPY W/ URETERAL STENT PLACEMENT: SHX1429

## 2023-08-22 LAB — GLUCOSE, CAPILLARY
Glucose-Capillary: 201 mg/dL — ABNORMAL HIGH (ref 70–99)
Glucose-Capillary: 211 mg/dL — ABNORMAL HIGH (ref 70–99)
Glucose-Capillary: 350 mg/dL — ABNORMAL HIGH (ref 70–99)
Glucose-Capillary: 370 mg/dL — ABNORMAL HIGH (ref 70–99)

## 2023-08-22 LAB — CBC WITH DIFFERENTIAL/PLATELET
Abs Immature Granulocytes: 0.08 10*3/uL — ABNORMAL HIGH (ref 0.00–0.07)
Basophils Absolute: 0 10*3/uL (ref 0.0–0.1)
Basophils Relative: 0 %
Eosinophils Absolute: 0 10*3/uL (ref 0.0–0.5)
Eosinophils Relative: 0 %
HCT: 34.9 % — ABNORMAL LOW (ref 39.0–52.0)
Hemoglobin: 11.9 g/dL — ABNORMAL LOW (ref 13.0–17.0)
Immature Granulocytes: 1 %
Lymphocytes Relative: 7 %
Lymphs Abs: 0.8 10*3/uL (ref 0.7–4.0)
MCH: 28.9 pg (ref 26.0–34.0)
MCHC: 34.1 g/dL (ref 30.0–36.0)
MCV: 84.7 fL (ref 80.0–100.0)
Monocytes Absolute: 0.4 10*3/uL (ref 0.1–1.0)
Monocytes Relative: 4 %
Neutro Abs: 9.8 10*3/uL — ABNORMAL HIGH (ref 1.7–7.7)
Neutrophils Relative %: 88 %
Platelets: 324 10*3/uL (ref 150–400)
RBC: 4.12 MIL/uL — ABNORMAL LOW (ref 4.22–5.81)
RDW: 12.3 % (ref 11.5–15.5)
WBC: 11.1 10*3/uL — ABNORMAL HIGH (ref 4.0–10.5)
nRBC: 0 % (ref 0.0–0.2)

## 2023-08-22 LAB — URINALYSIS, ROUTINE W REFLEX MICROSCOPIC
Bilirubin Urine: NEGATIVE
Glucose, UA: >=500 mg/dL — AB
Ketones, ur: NEGATIVE mg/dL
Nitrite: NEGATIVE
Protein, ur: 30 mg/dL — AB
RBC / HPF: >50 RBC/hpf (ref 0–5)
Specific Gravity, Urine: 1.012 (ref 1.005–1.030)
pH: 5 (ref 5.0–8.0)

## 2023-08-22 LAB — CBG MONITORING, ED: Glucose-Capillary: 252 mg/dL — ABNORMAL HIGH (ref 70–99)

## 2023-08-22 LAB — HEMOGLOBIN A1C
Hgb A1c MFr Bld: 8.4 % — ABNORMAL HIGH (ref 4.8–5.6)
Mean Plasma Glucose: 194.38 mg/dL

## 2023-08-22 LAB — COMPREHENSIVE METABOLIC PANEL WITH GFR
ALT: 21 U/L (ref 0–44)
AST: 18 U/L (ref 15–41)
Albumin: 3.7 g/dL (ref 3.5–5.0)
Alkaline Phosphatase: 49 U/L (ref 38–126)
Anion gap: 14 (ref 5–15)
BUN: 31 mg/dL — ABNORMAL HIGH (ref 6–20)
CO2: 18 mmol/L — ABNORMAL LOW (ref 22–32)
Calcium: 9.4 mg/dL (ref 8.9–10.3)
Chloride: 100 mmol/L (ref 98–111)
Creatinine, Ser: 2.8 mg/dL — ABNORMAL HIGH (ref 0.61–1.24)
GFR, Estimated: 27 mL/min — ABNORMAL LOW (ref >60)
Glucose, Bld: 375 mg/dL — ABNORMAL HIGH (ref 70–99)
Potassium: 5.8 mmol/L — ABNORMAL HIGH (ref 3.5–5.1)
Sodium: 132 mmol/L — ABNORMAL LOW (ref 135–145)
Total Bilirubin: 0.7 mg/dL (ref 0.0–1.2)
Total Protein: 7 g/dL (ref 6.5–8.1)

## 2023-08-22 LAB — HIV ANTIBODY (ROUTINE TESTING W REFLEX): HIV Screen 4th Generation wRfx: NONREACTIVE

## 2023-08-22 LAB — LIPASE, BLOOD: Lipase: 23 U/L (ref 11–51)

## 2023-08-22 SURGERY — CYSTOSCOPY, WITH RETROGRADE PYELOGRAM AND URETERAL STENT INSERTION
Anesthesia: General

## 2023-08-22 MED ORDER — PROPOFOL 10 MG/ML IV BOLUS
INTRAVENOUS | Status: DC | PRN
  Administered 2023-08-22: 200 mg via INTRAVENOUS

## 2023-08-22 MED ORDER — HEPARIN SODIUM (PORCINE) 5000 UNIT/ML IJ SOLN
5000.0000 [IU] | Freq: Three times a day (TID) | INTRAMUSCULAR | Status: DC
  Administered 2023-08-22 – 2023-08-23 (×4): 5000 [IU] via SUBCUTANEOUS
  Filled 2023-08-22 (×4): qty 1

## 2023-08-22 MED ORDER — ATENOLOL 50 MG PO TABS
50.0000 mg | ORAL_TABLET | Freq: Two times a day (BID) | ORAL | Status: DC
  Administered 2023-08-22 – 2023-08-23 (×2): 50 mg via ORAL
  Filled 2023-08-22 (×2): qty 1

## 2023-08-22 MED ORDER — INSULIN ASPART 100 UNIT/ML IJ SOLN
0.0000 [IU] | INTRAMUSCULAR | Status: DC
  Administered 2023-08-22: 15 [IU] via SUBCUTANEOUS
  Administered 2023-08-22: 5 [IU] via SUBCUTANEOUS
  Administered 2023-08-23 (×3): 8 [IU] via SUBCUTANEOUS
  Administered 2023-08-23: 11 [IU] via SUBCUTANEOUS

## 2023-08-22 MED ORDER — MIDAZOLAM HCL 2 MG/2ML IJ SOLN
INTRAMUSCULAR | Status: AC
  Filled 2023-08-22: qty 2

## 2023-08-22 MED ORDER — MORPHINE SULFATE (PF) 4 MG/ML IV SOLN
4.0000 mg | Freq: Once | INTRAVENOUS | Status: AC
  Administered 2023-08-22: 4 mg via INTRAVENOUS
  Filled 2023-08-22: qty 1

## 2023-08-22 MED ORDER — DEXAMETHASONE SODIUM PHOSPHATE 10 MG/ML IJ SOLN
INTRAMUSCULAR | Status: AC
  Filled 2023-08-22: qty 1

## 2023-08-22 MED ORDER — PHENYLEPHRINE 80 MCG/ML (10ML) SYRINGE FOR IV PUSH (FOR BLOOD PRESSURE SUPPORT)
PREFILLED_SYRINGE | INTRAVENOUS | Status: DC | PRN
  Administered 2023-08-22 (×3): 80 ug via INTRAVENOUS

## 2023-08-22 MED ORDER — SODIUM CHLORIDE 0.9 % IV SOLN: INTRAVENOUS | Status: DC

## 2023-08-22 MED ORDER — FENTANYL CITRATE (PF) 250 MCG/5ML IJ SOLN
INTRAMUSCULAR | Status: AC
  Filled 2023-08-22: qty 5

## 2023-08-22 MED ORDER — CIPROFLOXACIN IN D5W 400 MG/200ML IV SOLN
INTRAVENOUS | Status: AC
  Filled 2023-08-22: qty 200

## 2023-08-22 MED ORDER — CHLORHEXIDINE GLUCONATE 0.12 % MT SOLN
OROMUCOSAL | Status: AC
Start: 2023-08-22 — End: 2023-08-22
  Administered 2023-08-22: 15 mL via OROMUCOSAL
  Filled 2023-08-22: qty 15

## 2023-08-22 MED ORDER — LIDOCAINE 2% (20 MG/ML) 5 ML SYRINGE
INTRAMUSCULAR | Status: AC
  Filled 2023-08-22: qty 5

## 2023-08-22 MED ORDER — IOHEXOL 300 MG/ML  SOLN
INTRAMUSCULAR | Status: DC | PRN
  Administered 2023-08-22: 20 mL via URETHRAL

## 2023-08-22 MED ORDER — TAMSULOSIN HCL 0.4 MG PO CAPS
0.4000 mg | ORAL_CAPSULE | Freq: Every day | ORAL | Status: DC
Start: 2023-08-23 — End: 2023-08-23
  Administered 2023-08-23: 0.4 mg via ORAL
  Filled 2023-08-22: qty 1

## 2023-08-22 MED ORDER — BUSPIRONE HCL 5 MG PO TABS
10.0000 mg | ORAL_TABLET | Freq: Three times a day (TID) | ORAL | Status: DC
  Administered 2023-08-22 – 2023-08-23 (×3): 10 mg via ORAL
  Filled 2023-08-22 (×3): qty 2

## 2023-08-22 MED ORDER — ORAL CARE MOUTH RINSE: 15.0000 mL | Freq: Once | OROMUCOSAL | Status: AC

## 2023-08-22 MED ORDER — SODIUM CHLORIDE 0.9 % IV BOLUS
1000.0000 mL | Freq: Once | INTRAVENOUS | Status: AC
  Administered 2023-08-22: 1000 mL via INTRAVENOUS

## 2023-08-22 MED ORDER — CIPROFLOXACIN IN D5W 400 MG/200ML IV SOLN
400.0000 mg | INTRAVENOUS | Status: AC
  Administered 2023-08-22: 400 mg via INTRAVENOUS

## 2023-08-22 MED ORDER — FENOFIBRATE 160 MG PO TABS
160.0000 mg | ORAL_TABLET | Freq: Every day | ORAL | Status: DC
  Administered 2023-08-23: 160 mg via ORAL
  Filled 2023-08-22: qty 1

## 2023-08-22 MED ORDER — PROPOFOL 10 MG/ML IV BOLUS
INTRAVENOUS | Status: AC
  Filled 2023-08-22: qty 20

## 2023-08-22 MED ORDER — AMLODIPINE BESYLATE 10 MG PO TABS
10.0000 mg | ORAL_TABLET | Freq: Every day | ORAL | Status: DC
  Administered 2023-08-22 – 2023-08-23 (×2): 10 mg via ORAL
  Filled 2023-08-22 (×2): qty 1

## 2023-08-22 MED ORDER — HYDROMORPHONE HCL 1 MG/ML IJ SOLN: 0.5000 mg | INTRAMUSCULAR | Status: DC | PRN

## 2023-08-22 MED ORDER — ONDANSETRON HCL 4 MG/2ML IJ SOLN
INTRAMUSCULAR | Status: AC
  Filled 2023-08-22: qty 2

## 2023-08-22 MED ORDER — ONDANSETRON HCL 4 MG/2ML IJ SOLN
4.0000 mg | Freq: Once | INTRAMUSCULAR | Status: AC
  Administered 2023-08-22: 4 mg via INTRAVENOUS
  Filled 2023-08-22: qty 2

## 2023-08-22 MED ORDER — ONDANSETRON HCL 4 MG/2ML IJ SOLN
4.0000 mg | Freq: Three times a day (TID) | INTRAMUSCULAR | Status: DC | PRN
  Administered 2023-08-22: 4 mg via INTRAVENOUS

## 2023-08-22 MED ORDER — LIDOCAINE 2% (20 MG/ML) 5 ML SYRINGE
INTRAMUSCULAR | Status: DC | PRN
  Administered 2023-08-22: 100 mg via INTRAVENOUS

## 2023-08-22 MED ORDER — SODIUM CHLORIDE 0.9 % IR SOLN
Status: DC | PRN
  Administered 2023-08-22: 3000 mL via INTRAVESICAL

## 2023-08-22 MED ORDER — MIDAZOLAM HCL 2 MG/2ML IJ SOLN
INTRAMUSCULAR | Status: DC | PRN
  Administered 2023-08-22: 2 mg via INTRAVENOUS

## 2023-08-22 MED ORDER — FENTANYL CITRATE (PF) 250 MCG/5ML IJ SOLN
INTRAMUSCULAR | Status: DC | PRN
  Administered 2023-08-22: 50 ug via INTRAVENOUS

## 2023-08-22 MED ORDER — CHLORHEXIDINE GLUCONATE 0.12 % MT SOLN: 15.0000 mL | Freq: Once | OROMUCOSAL | Status: AC

## 2023-08-22 MED ORDER — DEXAMETHASONE SODIUM PHOSPHATE 10 MG/ML IJ SOLN
INTRAMUSCULAR | Status: DC | PRN
  Administered 2023-08-22: 10 mg via INTRAVENOUS

## 2023-08-22 SURGICAL SUPPLY — 29 items
BAG URINE DRAIN 2000ML AR STRL (UROLOGICAL SUPPLIES) IMPLANT
BAG URO CATCHER STRL LF (MISCELLANEOUS) ×1 IMPLANT
BASKET ZERO TIP NITINOL 2.4FR (BASKET) IMPLANT
BENZOIN TINCTURE PRP APPL 2/3 (GAUZE/BANDAGES/DRESSINGS) IMPLANT
CATH SILICONE 5CC 18FR (INSTRUMENTS) IMPLANT
CATH URETL OPEN END 6FR 70 (CATHETERS) ×1 IMPLANT
CLOTH BEACON ORANGE TIMEOUT ST (SAFETY) ×1 IMPLANT
DRSG TEGADERM 4X4.75 (GAUZE/BANDAGES/DRESSINGS) IMPLANT
ELECT REM PT RETURN 9FT ADLT (ELECTROSURGICAL) IMPLANT
ELECTRODE REM PT RTRN 9FT ADLT (ELECTROSURGICAL) IMPLANT
FIBER LASER FLEXIVA 365 (UROLOGICAL SUPPLIES) IMPLANT
FIBER LASER TRACTIP 200 (UROLOGICAL SUPPLIES) IMPLANT
GLOVE BIO SURGEON STRL SZ7 (GLOVE) ×1 IMPLANT
GOWN STRL REUS W/ TWL LRG LVL3 (GOWN DISPOSABLE) ×1 IMPLANT
GUIDEWIRE STR DUAL SENSOR (WIRE) ×1 IMPLANT
GUIDEWIRE ZIPWRE .038 STRAIGHT (WIRE) IMPLANT
HOLDER FOLEY CATH W/STRAP (MISCELLANEOUS) IMPLANT
KIT TURNOVER KIT B (KITS) ×1 IMPLANT
MANIFOLD NEPTUNE II (INSTRUMENTS) ×1 IMPLANT
NS IRRIG 500ML POUR BTL (IV SOLUTION) ×1 IMPLANT
PACK CYSTO (CUSTOM PROCEDURE TRAY) ×1 IMPLANT
SHEATH NAVIGATOR HD 11/13X28 (SHEATH) IMPLANT
SHEATH NAVIGATOR HD 11/13X36 (SHEATH) IMPLANT
SHEATH NAVIGATOR HD 12/14X46 (SHEATH) IMPLANT
SLEEVE SCD COMPRESS KNEE MED (STOCKING) ×1 IMPLANT
SOL .9 NS 3000ML IRR UROMATIC (IV SOLUTION) ×2 IMPLANT
STENT CONTOUR 6FRX26X.038 (STENTS) IMPLANT
TUBE CONNECTING 12X1/4 (SUCTIONS) IMPLANT
TUBING UROLOGY SET (TUBING) ×1 IMPLANT

## 2023-08-22 NOTE — Anesthesia Procedure Notes (Signed)
 Procedure Name: LMA Insertion Date/Time: 08/22/2023 2:27 PM  Performed by: Taiquan Campanaro C, CRNAPre-anesthesia Checklist: Patient identified, Emergency Drugs available, Suction available and Patient being monitored Patient Re-evaluated:Patient Re-evaluated prior to induction Oxygen Delivery Method: Circle System Utilized Preoxygenation: Pre-oxygenation with 100% oxygen Induction Type: IV induction Ventilation: Mask ventilation without difficulty LMA: LMA inserted LMA Size: 5.0 Number of attempts: 1 Airway Equipment and Method: Bite block Placement Confirmation: positive ETCO2 Tube secured with: Tape Dental Injury: Teeth and Oropharynx as per pre-operative assessment

## 2023-08-22 NOTE — ED Provider Notes (Signed)
 [place] EMERGENCY DEPARTMENT AT [facility] Provider Note   CSN: [id number] Arrival date & time: 08/22/23  4098     History  Chief Complaint  Patient presents with   Flank Pain   HPI [name] is a 48 y.o. male with history of kidney stones presenting for flank pain.  States that around Tuesday of this past week he started to have pain in the right flank but resolved on Friday.  Also endorsing some hematuria but no pain with urination.  States yesterday evening he was working in the yard and finished around 6 PM.  Around 9:30 PM when he went to bed started to have flank pain in the left flank.  It is nonradiating. states he not sure if this is muscular pain or pain from the kidney stone although he does state that it does feel similar to when he had a kidney stone in the past.  Denies fever.  Endorses nausea but no vomiting diarrhea.  Had a normal bowel movement yesterday evening.   Flank Pain       Home Medications Prior to Admission medications   Medication Sig Start Date End Date Taking? Authorizing Provider  amLODipine (NORVASC) 10 MG tablet Take 1 tablet by mouth once daily 07/12/22   Mliss Sax, MD  ascorbic acid (VITAMIN C) 500 MG tablet Take by mouth.    [provider]  atenolol (TENORMIN) 50 MG tablet Take 1 tablet by mouth twice daily 07/12/22   Garnette Gunner, MD  busPIRone (BUSPAR) 10 MG tablet Take 10 mg by mouth 3 (three) times daily.    [provider]  Cholecalciferol 50 MCG (2000 UT) CAPS Take by mouth.    [provider]  fenofibrate (TRICOR) 145 MG tablet Take 1 tablet (145 mg total) by mouth daily. 10/27/21   Mliss Sax, MD  gabapentin (NEURONTIN) 100 MG capsule Take 1 capsule (100 mg total) by mouth at bedtime as needed (insomnia). 05/20/22 06/19/22  Garnette Gunner, MD  Garlic 1000 MG CAPS Take by mouth. Patient not taking: Reported on 05/17/2022    [provider]  glipiZIDE  (GLUCOTROL) 10 MG tablet Take 1 tablet (10 mg total) by mouth 2 (two) times daily before a meal. 01/19/22   Mliss Sax, MD  irbesartan (AVAPRO) 150 MG tablet Take 1 tablet by mouth once daily 07/12/22   Mliss Sax, MD  metFORMIN (GLUCOPHAGE-XR) 500 MG 24 hr tablet Take 1 tablet (500 mg total) by mouth 2 (two) times daily with a meal. Take 1 tablet twice daily. 04/15/22   Mliss Sax, MD  Multiple Vitamin (MULTI VITAMIN DAILY PO) Take by mouth.    [provider]  Omega-3 Fatty Acids (FISH OIL) 1200 MG CAPS Take by mouth in the morning and at bedtime. 2,400 daily.    [provider]  Potassium 75 MG TABS Take by mouth. Patient not taking: Reported on 10/27/2021    [provider]  Red Yeast Rice Extract (RED YEAST RICE PO) Take by mouth. Patient not taking: Reported on 05/17/2022    [provider]  tamsulosin (FLOMAX) 0.4 MG CAPS capsule Take 1 capsule (0.4 mg total) by mouth daily. Patient not taking: Reported on 05/17/2022 08/14/19   Lorre Nick, MD  UNABLE TO FIND CBD 25 mg tablet, 1 tablet oral daily as needed    [provider]      Allergies    Doxepin, Escitalopram oxalate, Latex, Trazodone and  nefazodone, and Metformin and related    Review of Systems   Review of Systems  Genitourinary:  Positive for flank pain.    Physical Exam Updated Vital Signs BP (!) 150/82 (BP Location: Left Arm)   Pulse 76   Temp 97.6 F (36.4 C) (Oral)   Resp 16   Ht 5\' 9"  (1.753 m)   Wt 101.6 kg   SpO2 100%   BMI 33.08 kg/m  Physical Exam Vitals and nursing note reviewed.  HENT:     Head: Normocephalic and atraumatic.     Mouth/Throat:     Mouth: Mucous membranes are moist.  Eyes:     General:        Right eye: No discharge.        Left eye: No discharge.     Conjunctiva/sclera: Conjunctivae normal.  Cardiovascular:     Rate and Rhythm: Normal rate and regular rhythm.     Pulses: Normal pulses.     Heart sounds:  Normal heart sounds.  Pulmonary:     Effort: Pulmonary effort is normal.     Breath sounds: Normal breath sounds.  Abdominal:     General: Abdomen is flat.     Palpations: Abdomen is soft.     Tenderness: There is left CVA tenderness.  Skin:    General: Skin is warm and dry.  Neurological:     General: No focal deficit present.  Psychiatric:        Mood and Affect: Mood normal.     ED Results / Procedures / Treatments   Labs (all labs ordered are listed, but only abnormal results are displayed) Labs Reviewed  CBC WITH DIFFERENTIAL/PLATELET - Abnormal; Notable for the following components:      Result Value   WBC 11.1 (*)    RBC 4.12 (*)    Hemoglobin 11.9 (*)    HCT 34.9 (*)    Neutro Abs 9.8 (*)    Abs Immature Granulocytes 0.08 (*)    All other components within normal limits  COMPREHENSIVE METABOLIC PANEL WITH GFR - Abnormal; Notable for the following components:   Sodium 132 (*)    Potassium 5.8 (*)    CO2 18 (*)    Glucose, Bld 375 (*)    BUN 31 (*)    Creatinine, Ser 2.80 (*)    GFR, Estimated 27 (*)    All other components within normal limits  URINALYSIS, ROUTINE W REFLEX MICROSCOPIC - Abnormal; Notable for the following components:   Color, Urine AMBER (*)    APPearance CLOUDY (*)    Glucose, UA >=500 (*)    Hgb urine dipstick LARGE (*)    Protein, ur 30 (*)    Leukocytes,Ua TRACE (*)    Bacteria, UA FEW (*)    All other components within normal limits  LIPASE, BLOOD    EKG None  Radiology CT RENAL STONE STUDY Result Date: 08/22/2023 CLINICAL DATA:  Abdominal/flank pain, stone suspected. Right-sided flank pain. EXAM: CT ABDOMEN AND PELVIS WITHOUT CONTRAST TECHNIQUE: Multidetector CT imaging of the abdomen and pelvis was performed following the standard protocol without IV contrast. RADIATION DOSE REDUCTION: This exam was performed according to the departmental dose-optimization program which includes automated exposure control, adjustment of the mA  and/or kV according to patient size and/or use of iterative reconstruction technique. COMPARISON:  CT scan renal stone protocol from 08/14/2019. FINDINGS: Lower chest: There are patchy atelectatic changes at the left lung base. Bilateral visualized lungs otherwise clear. No overt  consolidation. No pleural effusion. The heart is normal in size. No pericardial effusion. Hepatobiliary: The liver is normal in size. Non-cirrhotic configuration. No suspicious mass. These is mild diffuse hepatic steatosis. No intrahepatic or extrahepatic bile duct dilation. Small volume dependent gallstone/sludge noted without imaging signs of acute cholecystitis. Normal gallbladder wall thickness. No pericholecystic inflammatory changes. Pancreas: Unremarkable. No pancreatic ductal dilatation or surrounding inflammatory changes. Spleen: Within normal limits. No focal lesion. Adrenals/Urinary Tract: Adrenal glands are unremarkable. No suspicious renal mass within the limitations of this unenhanced exam. There is a 6 x 10 mm left ureteropelvic junction calculus causing mild proximal hydronephrosis and hydroureter. Left ureter distal to the calculus is unremarkable per no other left nephroureterolithiasis bird there is also a 4 x 9 mm right mid ureteral calculus causing mild proximal hydronephrosis and hydroureter. Right ureter distal to the calculus is unremarkable per there is mild bilateral perinephric and periureteric fat stranding, left more than right, likely sequela of high-grade ureteric obstruction. No perianal abscess or collection. Urinary bladder is under distended, precluding optimal assessment. However, no large mass or stones identified. No perivesical fat stranding. Stomach/Bowel: No disproportionate dilation of the small or large bowel loops. No evidence of abnormal bowel wall thickening or inflammatory changes. The appendix is unremarkable. There are scattered diverticula mainly in the left hemi colon, without imaging signs  of diverticulitis. Vascular/Lymphatic: No ascites or pneumoperitoneum. No abdominal or pelvic lymphadenopathy, by size criteria. No aneurysmal dilation of the major abdominal arteries. Reproductive: Normal size prostate. Symmetric seminal vesicles. Other: There are small fat containing umbilical and left inguinal hernias. The soft tissues and abdominal wall are otherwise unremarkable. Musculoskeletal: No suspicious osseous lesions. IMPRESSION: 1. There is a 6 x 10 mm left ureteropelvic junction calculus causing mild proximal hydronephrosis and hydroureter. There is also a 4 x 9 mm right mid ureteral calculus causing mild proximal hydronephrosis and hydroureter. No other nephroureterolithiasis on either side. 2. Multiple other nonacute observations, as described above. Electronically Signed   By: Beula Brunswick M.D.   On: 08/22/2023 10:02    Procedures .Critical Care  Performed by: Janalee Mcmurray, PA-C Authorized by: Janalee Mcmurray, PA-C   Critical care provider statement:    Critical care time (minutes):  30   Critical care was necessary to treat or prevent imminent or life-threatening deterioration of the following conditions:  Renal failure (Moderate to large bilateral kidney stones with hydronephrosis and evidence of AKI)   Critical care was time spent personally by me on the following activities:  Development of treatment plan with patient or surrogate, discussions with consultants, evaluation of patient's response to treatment, examination of patient, ordering and review of laboratory studies, ordering and review of radiographic studies, ordering and performing treatments and interventions, pulse oximetry, re-evaluation of patient's condition and review of old charts     Medications Ordered in ED Medications  sodium chloride 0.9 % bolus 1,000 mL (0 mLs Intravenous Stopped 08/22/23 1038)  ondansetron (ZOFRAN) injection 4 mg (4 mg Intravenous Given 08/22/23 0908)  morphine (PF) 4 MG/ML  injection 4 mg (4 mg Intravenous Given 08/22/23 0910)    ED Course/ Medical Decision Making/ A&P                                 Medical Decision Making Amount and/or Complexity of Data Reviewed Labs: ordered. Radiology: ordered.  Risk Prescription drug management. Decision regarding hospitalization.   Initial Impression and Ddx  48 year old well-appearing male presenting for bilateral flank pain.  Exam is notable for left CVA tenderness.  DDx includes kidney stones, renal obstruction, kidney failure, pyelonephritis, acute cholecystitis, diverticulitis, rib fracture or pneumothorax, other. Patient PMH that increases complexity of ED encounter:   history of kidney stones  Interpretation of Diagnostics - I independent reviewed and interpreted the labs as followed: Reduced GFR, creatinine 2.8, BUN 31, leukocytosis 11.1, trace pyuria  - I independently visualized the following imaging with scope of interpretation limited to determining acute life threatening conditions related to emergency care: CT renal stone, which revealed bilateral kidney stones with evidence of hydronephrosis  Patient Reassessment and Ultimate Disposition/Management Workup suggestive of bilateral kidney stones with obstruction and AKI.  At this time associated infection is not definitively ruled out but unlikely given afebrile, looks well, and reassuring UA.  Discussed with urology, NP [place] advised that they will take him to the OR later today here at [facility].  Also advised to admit to medicine for his AKI.  Admitted to hospital service with Dr. Barkley Boot.  He is in n.p.o. status awaiting his procedure.  Patient management required discussion with the following services or consulting groups:  Hospitalist Service, Urology  Complexity of Problems Addressed Acute complicated illness or Injury  Additional Data Reviewed and Analyzed Further history obtained from: Further history from spouse/family member  and Past medical history and medications listed in the EMR  Patient Encounter Risk Assessment Consideration of hospitalization         Final Clinical Impression(s) / ED Diagnoses Final diagnoses:  Kidney stone  Acute kidney injury [facility])    Rx / DC Orders ED Discharge Orders     None         Janalee Mcmurray, PA-C 08/22/23 1248    Auston Blush, MD 08/25/23 [phone]

## 2023-08-22 NOTE — Anesthesia Preprocedure Evaluation (Addendum)
 Anesthesia Evaluation  Patient identified by MRN, date of birth, ID band Patient awake    Reviewed: Allergy & Precautions, H&P , NPO status , Patient's Chart, lab work & pertinent test results  Airway Mallampati: II  TM Distance: >3 FB Neck ROM: Full    Dental no notable dental hx.    Pulmonary sleep apnea    Pulmonary exam normal breath sounds clear to auscultation       Cardiovascular hypertension, Normal cardiovascular exam Rhythm:Regular Rate:Normal     Neuro/Psych Seizures -,  PSYCHIATRIC DISORDERS Anxiety Depression       GI/Hepatic negative GI ROS, Neg liver ROS,,,  Endo/Other  diabetes, Type 2    Renal/GU Kidney Stone with obstructive uropathy  negative genitourinary   Musculoskeletal negative musculoskeletal ROS (+)    Abdominal   Peds negative pediatric ROS (+)  Hematology  (+) Blood dyscrasia, anemia   Anesthesia Other Findings   Reproductive/Obstetrics negative OB ROS                              Anesthesia Physical Anesthesia Plan  ASA: 3  Anesthesia Plan: General   Post-op Pain Management: Tylenol PO (pre-op)*   Induction: Intravenous  PONV Risk Score and Plan: 2 and Ondansetron, Dexamethasone and Treatment may vary due to age or medical condition  Airway Management Planned: LMA  Additional Equipment: None  Intra-op Plan:   Post-operative Plan: Extubation in OR  Informed Consent: I have reviewed the patients History and Physical, chart, labs and discussed the procedure including the risks, benefits and alternatives for the proposed anesthesia with the patient or authorized representative who has indicated his/her understanding and acceptance.     Dental advisory given  Plan Discussed with: CRNA  Anesthesia Plan Comments:          Anesthesia Quick Evaluation

## 2023-08-22 NOTE — Hospital Course (Signed)
  February: at [facility], abnormality in urine?  Changes in urine, intermittently brown Started having pain last Tuesday Some episodes of hematuria  Last night pain woke him from sleep Pain is upper mid left side back to top of buttocks No pain on right Felt like a drill. Has been intermittent but last night was persistent Has been taking NSAIDs and tylenol for pain, helpful until last night  No fever Has had more sputum production, may be related to allergies  Peed twice in the ED after receiving fluids  Last episode of kidney stones in 2022, had urethral stent Two other prior episodes of stones  PMH T2DM  Amlodipine 10mg  daily Atenolol 50mg  daily Buspar 10mg  daily Fenofibrate 145mg  daily Glipizide 10mg  BID Irbesartan 150mg  daily Metformin 500mg  BID   Didn't take any medications today  Social:  Lives with wife Independent in ADLs/iADLs Work: [employer]eron[employer] company No tobacco Rare alcohol No recreational drugs

## 2023-08-22 NOTE — ED Notes (Signed)
 To OR with transport. Pt denies questions or needs.

## 2023-08-22 NOTE — ED Triage Notes (Signed)
 Pt POV with complaints of "kidney stones."  Hx of same.  Pt reports that right now his right side is what is hurting; last week was the left.

## 2023-08-22 NOTE — Transfer of Care (Signed)
 Immediate Anesthesia Transfer of Care Note  Patient: [name]  Procedure(s) Performed: CYSTOSCOPY, WITH RETROGRADE PYELOGRAM AND URETERAL STENT INSERTION Dilitation  Patient Location: PACU  Anesthesia Type:General  Level of Consciousness: awake and sedated  Airway & Oxygen Therapy: Patient Spontanous Breathing and Patient connected to face mask oxygen  Post-op Assessment: Report given to RN and Post -op Vital signs reviewed and stable  Post vital signs: Reviewed and stable  Last Vitals:  Vitals Value Taken Time  BP 117/66 08/22/23 1515  Temp 37.3 C 08/22/23 1515  Pulse 70 08/22/23 1519  Resp 21 08/22/23 1519  SpO2 90 % 08/22/23 1519  Vitals shown include unfiled device data.  Last Pain:  Vitals:   08/22/23 1515  TempSrc:   PainSc: 0-No pain         Complications: No notable events documented.

## 2023-08-22 NOTE — Op Note (Signed)
 Operative Note  Preoperative diagnosis:  1.  Bilateral ureteral stones 2. Bilateral hydronephrosis 3. AKI 4. Hx of urethral stricture  Postoperative diagnosis: 1.  Same, bulbar urethral stricture  Procedure(s): 1.  Cystoscopy with urethral dilation 2. bilateral retrograde pyelogram with interpretation 3. bilateral ureteral stent placement 4. Fluoroscopy <1 hour with intraoperative interpretation  Surgeon: Irine Seal, MD  Assistants:  None  Anesthesia:  General  Complications:  None  EBL:  minimal  Specimens: 1. none  Drains/Catheters: 1.  Bilateral 6Fr x 26cm ureteral stent  Intraoperative findings:   Cysto found wide caliber fossa stricture, 4-6 french bulbar urethral stricture. This was dilated up to 24 french. Mild trilobar prostate enlargement Retrograde pyelogram demonstrated bilateral hydronephrosis. Successful bilateral ureteral stent placement with curl in the renal pelvis and bladder respectively.  Indication:  [name] is a 48 y.o. male with the above diagnoses. After reviewing the management options for treatment, he elected to proceed with the above surgical procedure(s). We have discussed the potential benefits and risks of the procedure, side effects of the proposed treatment, the likelihood of the patient achieving the goals of the procedure, and any potential problems that might occur during the procedure or recuperation. Informed consent has been obtained.  Description of procedure: The patient was taken to the operating room and general anesthesia was induced.  The patient was placed in the dorsal lithotomy position, prepped and draped in the usual sterile fashion, and preoperative antibiotics were administered. A preoperative time-out was performed.   Cystourethroscopy was performed.  I had to gently dilate his fossa to insert the cystoscope. Once the camera was inserted, I found a 4-6 caliber bulbar urethral stricture. I placed a sensor wire  through the stricture into the bladder. Goodwin sounds were then used to serially dilate from 12 french to 24 french. Thereafter I was able to easily advance the cystoscope.   Attention then turned to the left ureteral orifice. A 0.038 zip wire was passed through the left orifice and over the wire a 5 Fr open ended catheter was inserted and passed up to the level of the renal pelvis. Omnipaque contrast was injected through the ureteral catheter and a retrograde pyelogram was performed with findings as dictated above. The wire was then replaced and the open ended catheter was removed.   A 6Fr x 26cm ureteral stent was advance over the wire. The stent was positioned appropriately under fluoroscopic and cystoscopic guidance.  The wire was then removed with an adequate stent curl noted in the renal pelvis as well as in the bladder.  The procedure was repeated in an identical fashion on the right.   An 68 french foley was then easily placed (silicone was used). The balloon was inflated with 10 cc  The patient appeared to tolerate the procedure well and without complications.  The patient was able to be awakened and transferred to the recovery unit in satisfactory condition.   Plan:   --return to floor --catheter should remain in place for 3 days. If patient is still in the hospital at that time (Thursday morning), it may be removed then. If discharged home before, he should f/u with GU for voiding tiral --will arrange for bilateral vs staged ureteroscopy as outpatient.   Christopher Seat MD [facility]  Pager: [phone]

## 2023-08-22 NOTE — Progress Notes (Signed)
 Called to get report from ED nurse @ 1245. No answer. Asked Diplomatic Services operational officer to tell the nurse to give me a call back at [phone]. Transport sent for patient.

## 2023-08-22 NOTE — Discharge Instructions (Signed)

## 2023-08-22 NOTE — Anesthesia Postprocedure Evaluation (Signed)
 Anesthesia Post Note  Patient: [name]  Procedure(s) Performed: CYSTOSCOPY, WITH RETROGRADE PYELOGRAM AND URETERAL STENT INSERTION Dilitation     Patient location during evaluation: PACU Anesthesia Type: General Level of consciousness: awake and alert Pain management: pain level controlled Vital Signs Assessment: post-procedure vital signs reviewed and stable Respiratory status: spontaneous breathing, nonlabored ventilation, respiratory function stable and patient connected to nasal cannula oxygen Cardiovascular status: blood pressure returned to baseline and stable Postop Assessment: no apparent nausea or vomiting Anesthetic complications: no   No notable events documented.  Last Vitals:  Vitals:   08/22/23 1545 08/22/23 1554  BP: 129/80 129/84  Pulse: 74 72  Resp: 11 18  Temp: 37.2 C (!) 36.4 C  SpO2: 95% 97%    Last Pain:  Vitals:   08/22/23 1554  TempSrc: Oral  PainSc: 0-No pain                 Lethaniel Rave

## 2023-08-22 NOTE — Plan of Care (Signed)
  Problem: Education: Goal: Ability to describe self-care measures that may prevent or decrease complications (Diabetes Survival Skills Education) will improve 08/22/2023 1636 by Elvina Hammers, RN Outcome: Progressing 08/22/2023 1608 by Elvina Hammers, RN Outcome: Progressing Goal: Individualized Educational Video(s) 08/22/2023 1636 by Elvina Hammers, RN Outcome: Progressing 08/22/2023 1608 by Elvina Hammers, RN Outcome: Progressing   Problem: Coping: Goal: Ability to adjust to condition or change in health will improve 08/22/2023 1636 by Elvina Hammers, RN Outcome: Progressing 08/22/2023 1608 by Elvina Hammers, RN Outcome: Progressing   Problem: Fluid Volume: Goal: Ability to maintain a balanced intake and output will improve 08/22/2023 1636 by Elvina Hammers, RN Outcome: Progressing 08/22/2023 1608 by Elvina Hammers, RN Outcome: Progressing   Problem: Health Behavior/Discharge Planning: Goal: Ability to identify and utilize available resources and services will improve 08/22/2023 1636 by Elvina Hammers, RN Outcome: Progressing 08/22/2023 1608 by Elvina Hammers, RN Outcome: Progressing Goal: Ability to manage health-related needs will improve 08/22/2023 1636 by Elvina Hammers, RN Outcome: Progressing 08/22/2023 1608 by Elvina Hammers, RN Outcome: Progressing   Problem: Metabolic: Goal: Ability to maintain appropriate glucose levels will improve 08/22/2023 1636 by Elvina Hammers, RN Outcome: Progressing 08/22/2023 1608 by Elvina Hammers, RN Outcome: Progressing   Problem: Nutritional: Goal: Maintenance of adequate nutrition will improve 08/22/2023 1636 by Elvina Hammers, RN Outcome: Progressing 08/22/2023 1608 by Elvina Hammers, RN Outcome: Progressing Goal: Progress toward achieving an optimal weight will improve 08/22/2023 1636 by Elvina Hammers, RN Outcome: Progressing 08/22/2023 1608 by Elvina Hammers, RN Outcome: Progressing   Problem: Skin Integrity: Goal: Risk for  impaired skin integrity will decrease 08/22/2023 1636 by Elvina Hammers, RN Outcome: Progressing 08/22/2023 1608 by Elvina Hammers, RN Outcome: Progressing   Problem: Tissue Perfusion: Goal: Adequacy of tissue perfusion will improve 08/22/2023 1636 by Elvina Hammers, RN Outcome: Progressing 08/22/2023 1608 by Elvina Hammers, RN Outcome: Progressing

## 2023-08-22 NOTE — Consult Note (Signed)
 Urology Consult Note   Requesting Attending Physician:  Margarita Grizzle, MD Service Providing Consult: Urology  Consulting Attending: Dr.Machen   Reason for Consult:  bilateral obstructing stones, AKI  HPI: [name] is seen in consultation for reasons noted above at the request of Margarita Grizzle, MD. Patient is a 48 year old male presenting to [facility] emergency department for flank pain.  CT A/P significant for bilateral obstructing ureteral stones.He carries a history of urethral stricture and nephrolithiasis.  He was followed by Orlinda Blalock of [facility].   On my arrival patient was alert, oriented, and in no distress.  He did become emotional for the end of our conversation and is anxious about the procedure.  We reviewed the natural history of ureteral stone formation, future prevention, and surgical procedure.  All questions were answered to satisfaction.  ------------------  Assessment:  48 y.o. male with AKI and bilateral obstructing ureteral stones   Recommendations: # AKI # Bilateral ureteral stones #UTI Urinalysis consistent with UTI, though no systemic symptoms. To the OR with Dr.Machen for bilateral ureteral stent placement Agree with empiric ABX and rehydration Definitive stone management on an outpatient basis.     Case and plan discussed with Dr. Lafonda Mosses  Past Medical History: Past Medical History:  Diagnosis Date   Hypercholesteremia    Hypertension    Seizure [facility])    as a child    Past Surgical History:  Past Surgical History:  Procedure Laterality Date   arm fracture Left     Medication: No current facility-administered medications for this encounter.   Current Outpatient Medications  Medication Sig Dispense Refill   amLODipine (NORVASC) 10 MG tablet Take 1 tablet by mouth once daily 30 tablet 0   ascorbic acid (VITAMIN C) 500 MG tablet Take by mouth.     atenolol (TENORMIN) 50 MG tablet Take 1 tablet by mouth twice daily 60  tablet 0   busPIRone (BUSPAR) 10 MG tablet Take 10 mg by mouth 3 (three) times daily.     Cholecalciferol 50 MCG (2000 UT) CAPS Take by mouth.     fenofibrate (TRICOR) 145 MG tablet Take 1 tablet (145 mg total) by mouth daily. 90 tablet 2   gabapentin (NEURONTIN) 100 MG capsule Take 1 capsule (100 mg total) by mouth at bedtime as needed (insomnia). 30 capsule 0   Garlic 1000 MG CAPS Take by mouth. (Patient not taking: Reported on 05/17/2022)     glipiZIDE (GLUCOTROL) 10 MG tablet Take 1 tablet (10 mg total) by mouth 2 (two) times daily before a meal. 60 tablet 3   irbesartan (AVAPRO) 150 MG tablet Take 1 tablet by mouth once daily 30 tablet 0   metFORMIN (GLUCOPHAGE-XR) 500 MG 24 hr tablet Take 1 tablet (500 mg total) by mouth 2 (two) times daily with a meal. Take 1 tablet twice daily. 60 tablet 2   Multiple Vitamin (MULTI VITAMIN DAILY PO) Take by mouth.     Omega-3 Fatty Acids (FISH OIL) 1200 MG CAPS Take by mouth in the morning and at bedtime. 2,400 daily.     Potassium 75 MG TABS Take by mouth. (Patient not taking: Reported on 10/27/2021)     Red Yeast Rice Extract (RED YEAST RICE PO) Take by mouth. (Patient not taking: Reported on 05/17/2022)     tamsulosin (FLOMAX) 0.4 MG CAPS capsule Take 1 capsule (0.4 mg total) by mouth daily. (Patient not taking: Reported on 05/17/2022) 30 capsule 0   UNABLE TO FIND CBD 25  mg tablet, 1 tablet oral daily as needed      Allergies: Allergies  Allergen Reactions   Doxepin     Causes irregular sleep patterns.   Escitalopram Oxalate Nausea And Vomiting    Sleepy   headache   Latex Other (See Comments)    Certain gloves cause itching    Trazodone And Nefazodone    Metformin And Related Diarrhea    Social History: Social History   Tobacco Use   Smoking status: Never   Smokeless tobacco: Never  Vaping Use   Vaping status: Never Used  Substance Use Topics   Alcohol use: Yes    Comment: occasioanal   Drug use: Never    Family History History  reviewed. No pertinent family history.  Review of Systems  Genitourinary:  Positive for flank pain. Negative for dysuria, frequency, hematuria and urgency.     Objective   Vital signs in last 24 hours: BP 136/81 (BP Location: Left Arm)   Pulse 74   Temp 97.6 F (36.4 C) (Oral)   Resp 17   Ht 5\' 9"  (1.753 m)   Wt 101.6 kg   SpO2 96%   BMI 33.08 kg/m   Physical Exam General: A&O, resting, appropriate HEENT: [place]/AT Pulmonary: Normal work of breathing Cardiovascular: no cyanosis Neuro: Appropriate, no focal neurological deficits  Most Recent Labs: Lab Results  Component Value Date   WBC 11.1 (H) 08/22/2023   HGB 11.9 (L) 08/22/2023   HCT 34.9 (L) 08/22/2023   PLT 324 08/22/2023    Lab Results  Component Value Date   NA 132 (L) 08/22/2023   K 5.8 (H) 08/22/2023   CL 100 08/22/2023   CO2 18 (L) 08/22/2023   BUN 31 (H) 08/22/2023   CREATININE 2.80 (H) 08/22/2023   CALCIUM 9.4 08/22/2023    No results found for: "INR", "APTT"   Urine Culture: @[id number] (laburin,org,[id number])@   IMAGING: CT RENAL STONE STUDY Result Date: 08/22/2023 CLINICAL DATA:  Abdominal/flank pain, stone suspected. Right-sided flank pain. EXAM: CT ABDOMEN AND PELVIS WITHOUT CONTRAST TECHNIQUE: Multidetector CT imaging of the abdomen and pelvis was performed following the standard protocol without IV contrast. RADIATION DOSE REDUCTION: This exam was performed according to the departmental dose-optimization program which includes automated exposure control, adjustment of the mA and/or kV according to patient size and/or use of iterative reconstruction technique. COMPARISON:  CT scan renal stone protocol from 08/14/2019. FINDINGS: Lower chest: There are patchy atelectatic changes at the left lung base. Bilateral visualized lungs otherwise clear. No overt consolidation. No pleural effusion. The heart is normal in size. No pericardial effusion. Hepatobiliary: The liver is normal in size. Non-cirrhotic  configuration. No suspicious mass. These is mild diffuse hepatic steatosis. No intrahepatic or extrahepatic bile duct dilation. Small volume dependent gallstone/sludge noted without imaging signs of acute cholecystitis. Normal gallbladder wall thickness. No pericholecystic inflammatory changes. Pancreas: Unremarkable. No pancreatic ductal dilatation or surrounding inflammatory changes. Spleen: Within normal limits. No focal lesion. Adrenals/Urinary Tract: Adrenal glands are unremarkable. No suspicious renal mass within the limitations of this unenhanced exam. There is a 6 x 10 mm left ureteropelvic junction calculus causing mild proximal hydronephrosis and hydroureter. Left ureter distal to the calculus is unremarkable per no other left nephroureterolithiasis bird there is also a 4 x 9 mm right mid ureteral calculus causing mild proximal hydronephrosis and hydroureter. Right ureter distal to the calculus is unremarkable per there is mild bilateral perinephric and periureteric fat stranding, left more than right, likely sequela of high-grade ureteric  obstruction. No perianal abscess or collection. Urinary bladder is under distended, precluding optimal assessment. However, no large mass or stones identified. No perivesical fat stranding. Stomach/Bowel: No disproportionate dilation of the small or large bowel loops. No evidence of abnormal bowel wall thickening or inflammatory changes. The appendix is unremarkable. There are scattered diverticula mainly in the left hemi colon, without imaging signs of diverticulitis. Vascular/Lymphatic: No ascites or pneumoperitoneum. No abdominal or pelvic lymphadenopathy, by size criteria. No aneurysmal dilation of the major abdominal arteries. Reproductive: Normal size prostate. Symmetric seminal vesicles. Other: There are small fat containing umbilical and left inguinal hernias. The soft tissues and abdominal wall are otherwise unremarkable. Musculoskeletal: No suspicious osseous  lesions. IMPRESSION: 1. There is a 6 x 10 mm left ureteropelvic junction calculus causing mild proximal hydronephrosis and hydroureter. There is also a 4 x 9 mm right mid ureteral calculus causing mild proximal hydronephrosis and hydroureter. No other nephroureterolithiasis on either side. 2. Multiple other nonacute observations, as described above. Electronically Signed   By: Beula Brunswick M.D.   On: 08/22/2023 10:02    ------  Alla Ar, NP Pager: [phone]   Please contact the urology consult pager with any further questions/concerns.

## 2023-08-22 NOTE — Plan of Care (Signed)

## 2023-08-22 NOTE — ED Notes (Signed)
 BS 252, transport here for pt to go to OR. Belongings with pt.

## 2023-08-22 NOTE — H&P (Signed)
 Date: 08/22/2023               Patient Name:  [name] MRN: [id number]  DOB: [birth year] Age / Sex: 48 y.o., male   PCP: Lavonda Jumbo, PA-C              Medical Service: Internal Medicine Teaching Service              Attending Physician: Dr. Mayford Knife Dorene Ar, MD    First Contact: Dr. Carlynn Purl Pager:   Second Contact: Dr. Geraldo Pitter Pager:   Third Contact [name], MS3 Pager:        After Hours (After 5p/  First Contact Pager: [phone]  weekends / holidays): Second Contact Pager: [phone]   Chief Complaint: flank pain   History of Present Illness: Mr [name] is a 48 year old male with a PMH of 3 previous episodes of kidney stones with 2 catheters and 1 lithotripsy, HTN, T2DM, HLD who presents with acute onset bilateral flank pain. He notes intermittent aching pain began at his R and L flank on 08/16/2023 and also has had intermittent dark urine for the last 2 weeks or so. He went to his PCP on 4/8 due to R sided back pain radiating to his groin. He reports an abnormal urinalysis at the time. He began taking Excedrin for pain BID with mild relief up until  last night. Last night he reports pain woke him from sleep and felt persistent like a "drill." Rated it 8/10. Reports feeling weak and nauseous at onset.   Currently he denies R sided pain but reports pain at the upper mid left side back to top of buttocks. Rates this pain 5-6/10. He is able to make urine, it continues to intermittently be dark vs clear. He has had more sputum production, but thinks this may be related to allergies. He denies fevers.  Didn't take any medications today   ED course: Morphine 4mg  Zofran for nausea given  IV fluids: pt urinated twice afterwards  UA CT renal stone study  Meds: Atenolol 50mg  daily for HTN Amlodipine 10mg  daily Irbesartan 150mg   Buspar 10mg  daily for anxiety  Fenofibrate 145mg  daily  Glipizide 10mg  BID for diabetes Metformin 500mg  BID   Allergies: Allergies as of  08/22/2023 - Review Complete 08/22/2023  Allergen Reaction Noted   Doxepin Other (See Comments) 07/22/2022   Escitalopram oxalate Nausea And Vomiting 09/27/2017   Latex Other (See Comments) 06/21/2018   Trazodone and nefazodone  12/20/2013   Metformin and related Diarrhea 07/09/2021   Past medical history  PCP: Lavonda Jumbo, PA-C  History of kidney stones: Last episode of kidney stones in 2022, and he had a urethral stent placed 06/19/2020. Regarding his office visit for flank pain last week, a CT scan was ordered then but cancelled due to insurance rejection. His UA showed trace blood and brown urine.   T2DM, last A1c: 9.4 in 2023 HLD elevated cholesterol and  triglycerides in 2023. He reports good medication tolerance.  HTN he reports good medication tolerance.  Allergies and recurrent sinusitis, take Zyrtec PRN  Past surgical: L arm fracture  Family history:  Mother: kidney stones, diabetes Father: kidney stones, diabetes No kidney cancers or kidney disease in family.   Social history:  Lives with wife Independent in ADLs/iADLs Work: Aeronautical engineer company No tobacco Rare alcohol No recreational drugs   Social Drivers of Corporate investment banker Strain: Not on file  Food Insecurity: Low Risk  (06/16/2023)  Received from [facility]   Hunger Vital Sign    Worried About Running Out of Food in the Last Year: Never true    Ran Out of Food in the Last Year: Never true  Transportation Needs: No Transportation Needs (06/16/2023)   Received from [facility]    In the past 12 months, has lack of reliable transportation kept you from medical appointments, meetings, work or from getting things needed for daily living? : No  Physical Activity: Not on file  Stress: Not on file  Social Connections: Not on file  Intimate Partner Violence: Not on file    Review of Systems: Review of Systems  Constitutional:  Negative for fever.  Genitourinary:  Positive  for flank pain and hematuria. Negative for dysuria.    Physical Exam: Blood pressure (!) 151/89, pulse 85, temperature 97.7 F (36.5 C), resp. rate 17, height 5\' 9"  (1.753 m), weight 101.6 kg, SpO2 96%.  General: He appears to be in pain, face is flushed, and some mild diaphoresis. As the visit progressed his pain levels seemed to decrease Heart has RRR with no murmurs.  His lungs were clear to auscultation. Normal work of breathing. L CVA tenderness. No R CVA tenderness. No TTP of abdomen, normal bowel sounds No LEE, skin is warm and dry.   Lab results:    Latest Ref Rng & Units 08/22/2023    8:55 AM 07/09/2021    9:54 AM 03/20/2008   12:41 PM  CBC  WBC 4.0 - 10.5 K/uL 11.1  4.2    Hemoglobin 13.0 - 17.0 g/dL 59.5  63.8  75.6 POINT OF CARE RESULT   Hematocrit 39.0 - 52.0 % 34.9  37.1    Platelets 150 - 400 K/uL 324  239.0        Latest Ref Rng & Units 08/22/2023    8:55 AM 10/27/2021    8:42 AM 07/09/2021    9:54 AM  CMP  Glucose 70 - 99 mg/dL 433  295  188   BUN 6 - 20 mg/dL 31  16  16    Creatinine 0.61 - 1.24 mg/dL 4.16  6.06  3.01   Sodium 135 - 145 mmol/L 132  134  135   Potassium 3.5 - 5.1 mmol/L 5.8  4.2  4.0   Chloride 98 - 111 mmol/L 100  101  100   CO2 22 - 32 mmol/L 18  23  25    Calcium 8.9 - 10.3 mg/dL 9.4  9.4  9.4   Total Protein 6.5 - 8.1 g/dL 7.0   7.3   Total Bilirubin 0.0 - 1.2 mg/dL 0.7   0.5   Alkaline Phos 38 - 126 U/L 49   65   AST 15 - 41 U/L 18   22   ALT 0 - 44 U/L 21   39    Component Ref Range & Units (hover) 09:58 2 yr ago 4 yr ago 10 yr ago  Color, Urine AMBER Abnormal  YELLOW R YELLOW YELLOW  Comment: BIOCHEMICALS MAY BE AFFECTED BY COLOR  APPearance CLOUDY Abnormal  CLEAR R CLEAR CLOUDY Abnormal   Specific Gravity, Urine 1.012 >=1.030 Abnormal  R 1.009 1.018  pH 5.0 5.5 5.0 7.0  Glucose, UA >=500 Abnormal   NEGATIVE NEGATIVE  Hgb urine dipstick LARGE Abnormal  NEGATIVE R LARGE Abnormal  NEGATIVE  Bilirubin Urine NEGATIVE NEGATIVE R  NEGATIVE NEGATIVE  Ketones, ur NEGATIVE TRACE Abnormal  R NEGATIVE NEGATIVE  Protein, ur 30  Abnormal   NEGATIVE NEGATIVE  Nitrite NEGATIVE NEGATIVE R NEGATIVE NEGATIVE  Leukocytes,Ua TRACE Abnormal  NEGATIVE R NEGATIVE   RBC / HPF >50 none seen R 21-50   WBC, UA 11-20 0-2/hpf R 0-5   Bacteria, UA FEW Abnormal   RARE Abnormal    Squamous Epithelial / HPF 0-5 Rare(0-4/hpf) R 0-5 R   Mucus PRESENT  PRESENT CM   Comment: Performed at [facility], [address], [place], [place] [address]  Total Protein, Urine  NEGATIVE R    Urine Glucose  >=1000 Abnormal  R    Urobilinogen, UA  0.2 R  0.2 R  Mucus, UA  Presence of Abnormal  R    Leukocytes, UA    NEGATIVE R, CM   HIV pending A1c pending Lipase: 23 CBG 252   Imaging results:  CT RENAL STONE STUDY Result Date: 08/22/2023 CLINICAL DATA:  Abdominal/flank pain, stone suspected. Right-sided flank pain. EXAM: CT ABDOMEN AND PELVIS WITHOUT CONTRAST TECHNIQUE: Multidetector CT imaging of the abdomen and pelvis was performed following the standard protocol without IV contrast. RADIATION DOSE REDUCTION: This exam was performed according to the departmental dose-optimization program which includes automated exposure control, adjustment of the mA and/or kV according to patient size and/or use of iterative reconstruction technique. COMPARISON:  CT scan renal stone protocol from 08/14/2019. FINDINGS: Lower chest: There are patchy atelectatic changes at the left lung base. Bilateral visualized lungs otherwise clear. No overt consolidation. No pleural effusion. The heart is normal in size. No pericardial effusion. Hepatobiliary: The liver is normal in size. Non-cirrhotic configuration. No suspicious mass. These is mild diffuse hepatic steatosis. No intrahepatic or extrahepatic bile duct dilation. Small volume dependent gallstone/sludge noted without imaging signs of acute cholecystitis. Normal gallbladder wall thickness. No pericholecystic inflammatory  changes. Pancreas: Unremarkable. No pancreatic ductal dilatation or surrounding inflammatory changes. Spleen: Within normal limits. No focal lesion. Adrenals/Urinary Tract: Adrenal glands are unremarkable. No suspicious renal mass within the limitations of this unenhanced exam. There is a 6 x 10 mm left ureteropelvic junction calculus causing mild proximal hydronephrosis and hydroureter. Left ureter distal to the calculus is unremarkable per no other left nephroureterolithiasis bird there is also a 4 x 9 mm right mid ureteral calculus causing mild proximal hydronephrosis and hydroureter. Right ureter distal to the calculus is unremarkable per there is mild bilateral perinephric and periureteric fat stranding, left more than right, likely sequela of high-grade ureteric obstruction. No perianal abscess or collection. Urinary bladder is under distended, precluding optimal assessment. However, no large mass or stones identified. No perivesical fat stranding. Stomach/Bowel: No disproportionate dilation of the small or large bowel loops. No evidence of abnormal bowel wall thickening or inflammatory changes. The appendix is unremarkable. There are scattered diverticula mainly in the left hemi colon, without imaging signs of diverticulitis. Vascular/Lymphatic: No ascites or pneumoperitoneum. No abdominal or pelvic lymphadenopathy, by size criteria. No aneurysmal dilation of the major abdominal arteries. Reproductive: Normal size prostate. Symmetric seminal vesicles. Other: There are small fat containing umbilical and left inguinal hernias. The soft tissues and abdominal wall are otherwise unremarkable. Musculoskeletal: No suspicious osseous lesions. IMPRESSION: 1. There is a 6 x 10 mm left ureteropelvic junction calculus causing mild proximal hydronephrosis and hydroureter. There is also a 4 x 9 mm right mid ureteral calculus causing mild proximal hydronephrosis and hydroureter. No other nephroureterolithiasis on either  side. 2. Multiple other nonacute observations, as described above. Electronically Signed   By: Timoteo Expose.D.  On: 08/22/2023 10:02    Assessment & Plan by Problem: Principal Problem:   Acute bilateral obstructive uropathy Active Problems:   Essential hypertension   Type 2 diabetes mellitus with hyperglycemia (HCC)   Anxiety   Acute kidney injury [facility])  Mr Wetherington is a 48 year old male with a PMH of 3 previous episodes of kidney stones with 2 catheters and 1 lithotripsy, HTN, T2DM, HLD who presented to the ED with acute onset bilateral flank pain caused by acute bilateral obstructive uropathy 2/2 to nephrolithiasis complicated by post renal AKI.  Acute bilateral obstructive uropathy 2/2 to nephrolithiasis. Acute kidney injury His initial presentation with CT renal stone study results and his history of kidney stones is most consistent for nephrolithiasis. Urinalysis consistent with UTI. CMP demonstrates evidence of post renal AKI with Cr 2.08, BUN/Cr 11, his baseline Cr is 0.86. Additonally he has had hypokalemia with hyperkalemia. He continues to have fluctuating L flank pain but it seems to be moderately controlled on Dilaudid. He is scheduled for bilateral ureteral stent placement with urology tonight. They will workup stone management outpatient given his recurrent episodes of nephrolithiasis. - Urology F/U with bilateral ureteral stent placement  - Strict I/Os - Dilaudid PRN - Zofran PRN  - Empiric ciprofloxacin for surgery   Chronic Problems: Essential hypertension BP chronically elevated. 151/89.  Holding home amlodipine, atenolol, and irbesartan prior to surgery   Type 2 diabetes mellitus with hyperglycemia (HCC) Chronic. Last A1c 9.4. Could be contributing to proteinuria. Holding home metformin and glipizide   Anxiety Chronic. Holding home Buspar.   VTE prophylaxis: heparin injection 5,000 Units Start: 08/22/23 1400 SCD's Start: 08/22/23 1259 Diet: Regular IVF:  NS Code: Full  Discharge plan: 1-2 days   This is a Psychologist, occupational Note.  The care of the patient was discussed with Dr. Esaw Heckler and the assessment and plan was formulated with their assistance.  Please see their note for official documentation of the patient encounter.   Signed: Jere Monaco, Medical Student 08/22/2023, 1:08 PM    I have seen and examined the patient myself, and I have reviewed the note by Alfornia Imam, MS3 and was present during the interview and physical exam.    Signed: Jayson Michael, MD Internal Medicine Resident, PGY-1

## 2023-08-23 ENCOUNTER — Encounter (HOSPITAL_COMMUNITY): Payer: Self-pay | Admitting: Urology

## 2023-08-23 ENCOUNTER — Telehealth: Payer: Self-pay

## 2023-08-23 ENCOUNTER — Other Ambulatory Visit (HOSPITAL_COMMUNITY): Payer: Self-pay

## 2023-08-23 LAB — CBC
HCT: 33.4 % — ABNORMAL LOW (ref 39.0–52.0)
Hemoglobin: 11.6 g/dL — ABNORMAL LOW (ref 13.0–17.0)
MCH: 29.4 pg (ref 26.0–34.0)
MCHC: 34.7 g/dL (ref 30.0–36.0)
MCV: 84.6 fL (ref 80.0–100.0)
Platelets: 315 10*3/uL (ref 150–400)
RBC: 3.95 MIL/uL — ABNORMAL LOW (ref 4.22–5.81)
RDW: 12.5 % (ref 11.5–15.5)
WBC: 9 10*3/uL (ref 4.0–10.5)
nRBC: 0 % (ref 0.0–0.2)

## 2023-08-23 LAB — BASIC METABOLIC PANEL WITH GFR
Anion gap: 12 (ref 5–15)
BUN: 23 mg/dL — ABNORMAL HIGH (ref 6–20)
CO2: 22 mmol/L (ref 22–32)
Calcium: 9 mg/dL (ref 8.9–10.3)
Chloride: 104 mmol/L (ref 98–111)
Creatinine, Ser: 1.55 mg/dL — ABNORMAL HIGH (ref 0.61–1.24)
GFR, Estimated: 55 mL/min — ABNORMAL LOW (ref >60)
Glucose, Bld: 258 mg/dL — ABNORMAL HIGH (ref 70–99)
Potassium: 4.1 mmol/L (ref 3.5–5.1)
Sodium: 138 mmol/L (ref 135–145)

## 2023-08-23 LAB — GLUCOSE, CAPILLARY
Glucose-Capillary: 257 mg/dL — ABNORMAL HIGH (ref 70–99)
Glucose-Capillary: 255 mg/dL — ABNORMAL HIGH (ref 70–99)
Glucose-Capillary: 251 mg/dL — ABNORMAL HIGH (ref 70–99)

## 2023-08-23 MED ORDER — CIPROFLOXACIN HCL 500 MG PO TABS
500.0000 mg | ORAL_TABLET | Freq: Two times a day (BID) | ORAL | 0 refills | Status: DC
  Filled 2023-08-23: qty 8, 4d supply, fill #0

## 2023-08-23 MED ORDER — CHLORHEXIDINE GLUCONATE CLOTH 2 % EX PADS
6.0000 | MEDICATED_PAD | Freq: Every day | CUTANEOUS | Status: DC
  Administered 2023-08-23: 6 via TOPICAL

## 2023-08-23 MED ORDER — POLYETHYLENE GLYCOL 3350 17 G PO PACK: 17.0000 g | PACK | Freq: Every day | ORAL | Status: DC | PRN

## 2023-08-23 MED ORDER — SENNA 8.6 MG PO TABS
1.0000 | ORAL_TABLET | Freq: Every day | ORAL | Status: DC | PRN
Start: 2023-08-23 — End: 2023-08-23

## 2023-08-23 NOTE — TOC Transition Note (Signed)
 Transition of Care [facility]) - Discharge Note   Patient Details  Name: [name] MRN: [id number] Date of Birth: 02-04-1976  Transition of Care [facility]) CM/SW Contact:  Tom-Johnson, Malu Pellegrini Daphne, RN Phone Number: 08/23/2023, 3:11 PM   Clinical Narrative:     Patient is scheduled for discharge today.  Outpatient f/u, hospital f/u and discharge instructions on AVS. Prescriptions sent to [facility] pharmacy and patient will receive meds prior discharge. No TOC needs or recommendations noted. Sister, Aleda Hurl to transport at discharge.  No further TOC needs noted.     Final next level of care: Home/Self Care Barriers to Discharge: Barriers Resolved   Patient Goals and CMS Choice Patient states their goals for this hospitalization and ongoing recovery are:: To return home CMS Medicare.gov Compare Post Acute Care list provided to:: Patient Choice offered to / list presented to : NA      Discharge Placement                Patient to be transferred to facility by: Sister Name of family member notified: [facility]    Discharge Plan and Services Additional resources added to the After Visit Summary for                  DME Arranged: N/A DME Agency: NA       HH Arranged: NA HH Agency: NA        Social Drivers of Health (SDOH) Interventions SDOH Screenings   Food Insecurity: No Food Insecurity (08/22/2023)  Housing: Low Risk  (08/22/2023)  Transportation Needs: No Transportation Needs (08/22/2023)  Utilities: Not At Risk (08/22/2023)  Depression (PHQ2-9): Medium Risk (05/17/2022)  Tobacco Use: Low Risk  (08/22/2023)     Readmission Risk Interventions     No data to display

## 2023-08-23 NOTE — Progress Notes (Signed)
 1 Day Post-Op Subjective: NAEON.  Patient resting comfortably in bed and looks well on rounds today.  Pain is well-controlled.  No specific LUTS.  He was accompanied by his sister and the case and plan was reviewed with both.  All questions were answered to their satisfaction.  Objective: Vital signs in last 24 hours: Temp:  [97.5 F (36.4 C)-99.1 F (37.3 C)] 98.3 F (36.8 C) (04/15 0746) Pulse Rate:  [70-85] 85 (04/15 0803) Resp:  [11-20] 18 (04/15 0746) BP: (117-154)/(66-89) 125/80 (04/15 0803) SpO2:  [94 %-97 %] 96 % (04/15 0746) Weight:  [101.6 kg] 101.6 kg (04/14 1554)  Assessment/Plan: #Bilateral ureteral stones # Urethral stricture Bilateral ureteral stents with Dr.Machen on 08/22/2023. Dr. Dilated in OR.  Foley catheter to remain in place for at least 3 days.  Patient may discharge with leg bag-per his request, and follow-up closely in clinic. Definitive stone management on an outpatient basis  Intake/Output from previous day: 04/14 0701 - 04/15 0700 In: 1220 [P.O.:600; I.V.:420; IV Piggyback:200] Out: 4155 [Urine:4150; Blood:5]  Intake/Output this shift: Total I/O In: 240 [P.O.:240] Out: 500 [Urine:500]  Physical Exam:  General: Alert and oriented CV: No cyanosis Lungs: equal chest rise Abdomen: Soft, NTND, no rebound or guarding Gu: Foley catheter in place draining clear yellow urine.  Lab Results: Recent Labs    08/22/23 0855 08/23/23 0501  HGB 11.9* 11.6*  HCT 34.9* 33.4*   BMET Recent Labs    08/22/23 0855 08/23/23 0501  NA 132* 138  K 5.8* 4.1  CL 100 104  CO2 18* 22  GLUCOSE 375* 258*  BUN 31* 23*  CREATININE 2.80* 1.55*  CALCIUM 9.4 9.0     Studies/Results: DG C-Arm 1-60 Min-No Report Result Date: 08/22/2023 Fluoroscopy was utilized by the requesting physician.  No radiographic interpretation.   DG C-Arm 1-60 Min-No Report Result Date: 08/22/2023 Fluoroscopy was utilized by the requesting physician.  No radiographic  interpretation.   CT RENAL STONE STUDY Result Date: 08/22/2023 CLINICAL DATA:  Abdominal/flank pain, stone suspected. Right-sided flank pain. EXAM: CT ABDOMEN AND PELVIS WITHOUT CONTRAST TECHNIQUE: Multidetector CT imaging of the abdomen and pelvis was performed following the standard protocol without IV contrast. RADIATION DOSE REDUCTION: This exam was performed according to the departmental dose-optimization program which includes automated exposure control, adjustment of the mA and/or kV according to patient size and/or use of iterative reconstruction technique. COMPARISON:  CT scan renal stone protocol from 08/14/2019. FINDINGS: Lower chest: There are patchy atelectatic changes at the left lung base. Bilateral visualized lungs otherwise clear. No overt consolidation. No pleural effusion. The heart is normal in size. No pericardial effusion. Hepatobiliary: The liver is normal in size. Non-cirrhotic configuration. No suspicious mass. These is mild diffuse hepatic steatosis. No intrahepatic or extrahepatic bile duct dilation. Small volume dependent gallstone/sludge noted without imaging signs of acute cholecystitis. Normal gallbladder wall thickness. No pericholecystic inflammatory changes. Pancreas: Unremarkable. No pancreatic ductal dilatation or surrounding inflammatory changes. Spleen: Within normal limits. No focal lesion. Adrenals/Urinary Tract: Adrenal glands are unremarkable. No suspicious renal mass within the limitations of this unenhanced exam. There is a 6 x 10 mm left ureteropelvic junction calculus causing mild proximal hydronephrosis and hydroureter. Left ureter distal to the calculus is unremarkable per no other left nephroureterolithiasis bird there is also a 4 x 9 mm right mid ureteral calculus causing mild proximal hydronephrosis and hydroureter. Right ureter distal to the calculus is unremarkable per there is mild bilateral perinephric and periureteric fat stranding, left more  than right,  likely sequela of high-grade ureteric obstruction. No perianal abscess or collection. Urinary bladder is under distended, precluding optimal assessment. However, no large mass or stones identified. No perivesical fat stranding. Stomach/Bowel: No disproportionate dilation of the small or large bowel loops. No evidence of abnormal bowel wall thickening or inflammatory changes. The appendix is unremarkable. There are scattered diverticula mainly in the left hemi colon, without imaging signs of diverticulitis. Vascular/Lymphatic: No ascites or pneumoperitoneum. No abdominal or pelvic lymphadenopathy, by size criteria. No aneurysmal dilation of the major abdominal arteries. Reproductive: Normal size prostate. Symmetric seminal vesicles. Other: There are small fat containing umbilical and left inguinal hernias. The soft tissues and abdominal wall are otherwise unremarkable. Musculoskeletal: No suspicious osseous lesions. IMPRESSION: 1. There is a 6 x 10 mm left ureteropelvic junction calculus causing mild proximal hydronephrosis and hydroureter. There is also a 4 x 9 mm right mid ureteral calculus causing mild proximal hydronephrosis and hydroureter. No other nephroureterolithiasis on either side. 2. Multiple other nonacute observations, as described above. Electronically Signed   By: Beula Brunswick M.D.   On: 08/22/2023 10:02      LOS: 0 days   Alla Ar, NP [facility] Pager: [phone]  08/23/2023, 11:56 AM

## 2023-08-23 NOTE — Progress Notes (Signed)
 DISCHARGE NOTE HOME HENS[facility] TREAT to be discharged Home per MD order. Discussed prescriptions and follow up appointments with the patient. Prescriptions given to patient; medication list explained in detail. Patient verbalized understanding.  Skin clean, dry and intact without evidence of skin break down, no evidence of skin tears noted. IV catheter discontinued intact. Site without signs and symptoms of complications. Dressing and pressure applied. Pt denies pain at the site currently. No complaints noted.  Patient free of lines, drains, and wounds. Pt. Discharged with Foley in place. Foley teaching provided and pt. Return demonstrated proper care.  An After Visit Summary (AVS) was printed and given to the patient. Patient escorted via wheelchair, and discharged home via private auto.  Elvina Hammers, RN

## 2023-08-23 NOTE — Discharge Summary (Addendum)
 Name: [name] MRN: [id number] DOB: 02/02/1976 48 y.o. PCP: Lavonda Jumbo, PA-C  Date of Admission: 08/22/2023  8:37 AM Date of Discharge: 08/23/23 Attending Physician: Dr. Mayford Knife  Discharge Diagnosis: Principal Problem:   Acute bilateral obstructive uropathy Active Problems:   Essential hypertension   Type 2 diabetes mellitus with hyperglycemia (HCC)   Bilateral nephrolithiasis   Acute kidney injury [facility])    Discharge Medications: Allergies as of 08/23/2023       Reactions   Latex Itching   Certain gloves cause itching    Lexapro [escitalopram Oxalate] Nausea And Vomiting, Other (See Comments)   Drowsiness Headaches   Silenor [doxepin] Other (See Comments)   Causes irregular sleep patterns.   Trazodone And Nefazodone Other (See Comments)   Unknown reaction   Metformin And Related Diarrhea   Currently taking 1000mg  BID        Medication List     TAKE these medications    acetaminophen 500 MG tablet Commonly known as: TYLENOL Take 1,000 mg by mouth 2 (two) times daily as needed for moderate pain (pain score 4-6) or headache.   amLODipine 10 MG tablet Commonly known as: NORVASC Take 1 tablet by mouth once daily   aspirin EC 81 MG tablet Take 81 mg by mouth daily.   aspirin-acetaminophen-caffeine 250-250-65 MG tablet Commonly known as: EXCEDRIN MIGRAINE Take 2 tablets by mouth 2 (two) times daily as needed for headache (pain).   atenolol 50 MG tablet Commonly known as: TENORMIN Take 1 tablet by mouth twice daily   busPIRone 10 MG tablet Commonly known as: BUSPAR Take 10 mg by mouth daily.   ciprofloxacin 500 MG tablet Commonly known as: CIPRO Take 1 tablet (500 mg total) by mouth 2 (two) times daily.   fenofibrate 145 MG tablet Commonly known as: TRICOR Take 1 tablet (145 mg total) by mouth daily.   FISH OIL HIGH POTENCY PO Take 1 capsule by mouth 2 (two) times daily.   glipiZIDE 10 MG tablet Commonly known as: GLUCOTROL Take 1  tablet (10 mg total) by mouth 2 (two) times daily before a meal.   irbesartan 150 MG tablet Commonly known as: AVAPRO Take 1 tablet by mouth once daily   metFORMIN 500 MG 24 hr tablet Commonly known as: GLUCOPHAGE-XR Take 1 tablet (500 mg total) by mouth 2 (two) times daily with a meal. Take 1 tablet twice daily. What changed: how much to take   Multivitamin Men Tabs Take 1 tablet by mouth daily.   VITAMIN C PO Take 1 tablet by mouth daily.   VITAMIN D-3 PO Take 1 capsule by mouth daily.        Disposition and follow-up:   [name] was discharged from [facility] in Stable condition.  At the hospital follow up visit please address:  1.  Follow-up:  Bilateral nephrolithiasis: Assess for flank pain and urinary symptoms. Ensure follow up with urology and that voiding trial has been completed and foley removed   T2DM: patient is concerned about his A1c, which was 8.4. He does not like being on glipizide and would like to adjust his antiglycemic regimen  2.  Labs / imaging needed at time of follow-up: None  3.  Pending labs/ test needing follow-up: None  4.  Medication Changes  Started:   Stopped:  Changed:  Abx - Ciprofloxacin 500mg  BID for 4 days  End Date: 4/18  Follow-up Appointments: Urology, 09/02/23 at [facility], 09/13/23  Hospital Course by problem list:  Acute bilateral obstructive uropathy 2/2 nephrolithiasis Postrenal AKI Patient with history of multiple of episodes of nephrolithiasis presented to the ED with acute bilateral flank pain and dark urine for the past several days. In the ED patient was afebrile with stable vitals and able to make urine, with labwork significant for mild leukocytosis at 11.1, Cr 2.08, (baseline Cr is 0.86). UA dipstick was positive for Hg with trace leukocytes. He was given morphine for pain and Zofran for nausea w/o vomiting.  CT renal study showed bilateral kidney stones in  ureter L>R. Urology service was consulted, who performed a cytoscopy with retrograde pyelogram and bilateral urethral stent insertion without complications. Patient was also treated with prophylactic ciprofloxican in the setting of obstruction for potential UTI. On day of discharge labs showed, Cr 1.55, resolved leukocytosis, resolved hyperkalemia, resolved hyponatremia. All improved from time of admission.   Patient to be discharged with foley catheter in place, to be removed in 72 hours. Urology's plan also included that he should follow up with them outpatient for a voiding trail and ureteroscopy. They will perform kidney stone analysis outpatient.  Patient will need to continue ciprofloxacin 500 mg fbid or 4 days outpatient, and follow up with his PCP at [facility] in 1-2 weeks.  Essential hypertension Home amlodipine, atenolol were continued during admission.   T2DM A1c 8.4. Blood glucose managed with SSI during admission. Home metformin and glipizide were restarted at discharge.   Anxiety: Continued home buspar during admission.  Notably, patient takes ASA for primary prevention. He has discussed this with his PCP and would like to continue taking it. He is aware of the risks of possible bleeding.    Discharge Subjective: Patient feeling very well today. He is pain has been greatly relieved status post bilateral ureter stent by urology.  He would like to go home today. Discussed the plan including need for PCP and urology follow-up. All questions answered.  Discharge Exam:   BP 125/80   Pulse 85   Temp 98.3 F (36.8 C)   Resp 18   Ht 5\' 9"  (1.753 m)   Wt 101.6 kg   SpO2 96%   BMI 33.08 kg/m  Constitutional: well-appearing, in no acute distress Neuro: A&O x 3  Pertinent Labs, Studies, and Procedures:     Latest Ref Rng & Units 08/23/2023    5:01 AM 08/22/2023    8:55 AM 07/09/2021    9:54 AM  CBC  WBC 4.0 - 10.5 K/uL 9.0  11.1  4.2   Hemoglobin 13.0 - 17.0 g/dL 16.1   09.6  04.5   Hematocrit 39.0 - 52.0 % 33.4  34.9  37.1   Platelets 150 - 400 K/uL 315  324  239.0        Latest Ref Rng & Units 08/23/2023    5:01 AM 08/22/2023    8:55 AM 10/27/2021    8:42 AM  CMP  Glucose 70 - 99 mg/dL 409  811  914   BUN 6 - 20 mg/dL 23  31  16    Creatinine 0.61 - 1.24 mg/dL 7.82  9.56  2.13   Sodium 135 - 145 mmol/L 138  132  134   Potassium 3.5 - 5.1 mmol/L 4.1  5.8  4.2   Chloride 98 - 111 mmol/L 104  100  101   CO2 22 - 32 mmol/L 22  18  23    Calcium 8.9 - 10.3 mg/dL 9.0  9.4  9.4   Total Protein 6.5 - 8.1 g/dL  7.0    Total Bilirubin 0.0 - 1.2 mg/dL  0.7    Alkaline Phos 38 - 126 U/L  49    AST 15 - 41 U/L  18    ALT 0 - 44 U/L  21      DG C-Arm 1-60 Min-No Report Result Date: 08/22/2023 Fluoroscopy was utilized by the requesting physician.  No radiographic interpretation.   DG C-Arm 1-60 Min-No Report Result Date: 08/22/2023 Fluoroscopy was utilized by the requesting physician.  No radiographic interpretation.   CT RENAL STONE STUDY Result Date: 08/22/2023 CLINICAL DATA:  Abdominal/flank pain, stone suspected. Right-sided flank pain. EXAM: CT ABDOMEN AND PELVIS WITHOUT CONTRAST TECHNIQUE: Multidetector CT imaging of the abdomen and pelvis was performed following the standard protocol without IV contrast. RADIATION DOSE REDUCTION: This exam was performed according to the departmental dose-optimization program which includes automated exposure control, adjustment of the mA and/or kV according to patient size and/or use of iterative reconstruction technique. COMPARISON:  CT scan renal stone protocol from 08/14/2019. FINDINGS: Lower chest: There are patchy atelectatic changes at the left lung base. Bilateral visualized lungs otherwise clear. No overt consolidation. No pleural effusion. The heart is normal in size. No pericardial effusion. Hepatobiliary: The liver is normal in size. Non-cirrhotic configuration. No suspicious mass. These is mild diffuse hepatic  steatosis. No intrahepatic or extrahepatic bile duct dilation. Small volume dependent gallstone/sludge noted without imaging signs of acute cholecystitis. Normal gallbladder wall thickness. No pericholecystic inflammatory changes. Pancreas: Unremarkable. No pancreatic ductal dilatation or surrounding inflammatory changes. Spleen: Within normal limits. No focal lesion. Adrenals/Urinary Tract: Adrenal glands are unremarkable. No suspicious renal mass within the limitations of this unenhanced exam. There is a 6 x 10 mm left ureteropelvic junction calculus causing mild proximal hydronephrosis and hydroureter. Left ureter distal to the calculus is unremarkable per no other left nephroureterolithiasis bird there is also a 4 x 9 mm right mid ureteral calculus causing mild proximal hydronephrosis and hydroureter. Right ureter distal to the calculus is unremarkable per there is mild bilateral perinephric and periureteric fat stranding, left more than right, likely sequela of high-grade ureteric obstruction. No perianal abscess or collection. Urinary bladder is under distended, precluding optimal assessment. However, no large mass or stones identified. No perivesical fat stranding. Stomach/Bowel: No disproportionate dilation of the small or large bowel loops. No evidence of abnormal bowel wall thickening or inflammatory changes. The appendix is unremarkable. There are scattered diverticula mainly in the left hemi colon, without imaging signs of diverticulitis. Vascular/Lymphatic: No ascites or pneumoperitoneum. No abdominal or pelvic lymphadenopathy, by size criteria. No aneurysmal dilation of the major abdominal arteries. Reproductive: Normal size prostate. Symmetric seminal vesicles. Other: There are small fat containing umbilical and left inguinal hernias. The soft tissues and abdominal wall are otherwise unremarkable. Musculoskeletal: No suspicious osseous lesions. IMPRESSION: 1. There is a 6 x 10 mm left ureteropelvic  junction calculus causing mild proximal hydronephrosis and hydroureter. There is also a 4 x 9 mm right mid ureteral calculus causing mild proximal hydronephrosis and hydroureter. No other nephroureterolithiasis on either side. 2. Multiple other nonacute observations, as described above. Electronically Signed   By: Jules Schick M.D.   On: 08/22/2023 10:02    Signed: Monna Fam, MD PGY-1 08/23/2023, 8:59 PM   Pager: [phone]

## 2023-08-23 NOTE — Telephone Encounter (Signed)
 Advised pt that a referral is needed to see urology, which we cannot order as he has not established care with a PCP in our office yet. Advised to ask for referral from hospital physician while he is admitted. Pt states he will. No further questions or concerns.

## 2023-08-23 NOTE — Telephone Encounter (Signed)
 Copied from CRM [id number]. Topic: General - Other >> Aug 23, 2023 10:15 AM Allyne Areola wrote: Reason for CRM: Patient is admitted in the hospital due to kidney stones, he would like to know if we are aware of any Urologist offices in [place] or close to it he can schedule an appointment with after being discharged.

## 2023-08-23 NOTE — Inpatient Diabetes Management (Signed)
 Inpatient Diabetes Program Recommendations  AACE/ADA: New Consensus Statement on Inpatient Glycemic Control (2015)  Target Ranges:  Prepandial:   less than 140 mg/dL      Peak postprandial:   less than 180 mg/dL (1-2 hours)      Critically ill patients:  140 - 180 mg/dL   Lab Results  Component Value Date   GLUCAP 255 (H) 08/23/2023   HGBA1C 8.4 (H) 08/22/2023    Review of Glycemic Control  Latest Reference Range & Units 08/22/23 13:04 08/22/23 15:15 08/22/23 16:05 08/22/23 20:06 08/22/23 23:47 08/23/23 05:01 08/23/23 07:49  Glucose-Capillary 70 - 99 mg/dL 161 (H) 096 (H) 045 (H) 370 (H) 350 (H) 257 (H) 255 (H)   Diabetes history: DM 2 Outpatient Diabetes medications: Metformin 500 mg BID, Glipizide 10 mg BID  Current orders for Inpatient glycemic control:  Novolog 0-15 units Q4 hours  Note: Decadron 10 mg given yesterday A1c 8.4% on 4/14  Inpatient Diabetes Program Recommendations:    -   Consider one time dose of Semglee 8 units  Thanks,  Eloise Hake RN, MSN, BC-ADM Inpatient Diabetes Coordinator Team Pager [phone] (8a-5p)

## 2023-08-23 NOTE — Plan of Care (Signed)

## 2023-08-23 NOTE — TOC CM/SW Note (Signed)
 Transition of Care [facility]) - Inpatient Brief Assessment   Patient Details  Name: [name] MRN: [id number] Date of Birth: 03-12-1976  Transition of Care [facility]) CM/SW Contact:    Tom-Johnson, Angelique Ken, RN Phone Number: 08/23/2023, 11:49 AM   Clinical Narrative:  Patient presented to the ED with bilateral Flank pain. Admitted with Bilateral Obstructive Uropathy. Underwent Cystoscopy with Retrograde Pyelogram and Ureteral Stent insertion dilation. Urology following.  Has a foley cath.  CM went to speak with patient at bedside, Patient has concerns about his care and questions CM cannot answer. MD notified.   CM will continue to follow.        Transition of Care Asessment: Insurance and Status: Insurance coverage has been reviewed Patient has primary care physician: Yes Home environment has been reviewed: Yes Prior level of function:: Independent Prior/Current Home Services: No current home services Social Drivers of Health Review: SDOH reviewed no interventions necessary Readmission risk has been reviewed: Yes Transition of care needs: no transition of care needs at this time

## 2023-08-24 ENCOUNTER — Other Ambulatory Visit (HOSPITAL_COMMUNITY): Payer: Self-pay

## 2023-08-25 ENCOUNTER — Other Ambulatory Visit (HOSPITAL_COMMUNITY): Payer: Self-pay

## 2023-08-25 ENCOUNTER — Other Ambulatory Visit: Payer: Self-pay | Admitting: Urology

## 2023-08-25 MED ORDER — CIPROFLOXACIN HCL 500 MG PO TABS
500.0000 mg | ORAL_TABLET | Freq: Every day | ORAL | 0 refills | Status: AC
  Filled 2023-08-25: qty 8, 8d supply, fill #0

## 2023-08-30 ENCOUNTER — Ambulatory Visit: Admitting: Urology

## 2023-09-02 ENCOUNTER — Ambulatory Visit: Admitting: Urology

## 2023-10-10 ENCOUNTER — Encounter: Admitting: Family Medicine
